# Patient Record
Sex: Male | Born: 1995 | Race: Black or African American | Hispanic: No | Marital: Single | Smoking: Never smoker
Health system: Southern US, Community
[De-identification: ages and names within clinical notes are randomized; demographics above are authoritative.]

---
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
## 2018-04-15 ENCOUNTER — Ambulatory Visit: Payer: Commercial Managed Care - HMO | Admitting: Emergency Medicine

## 2018-04-15 ENCOUNTER — Encounter: Payer: Self-pay | Admitting: Emergency Medicine

## 2018-04-15 ENCOUNTER — Other Ambulatory Visit: Payer: Self-pay

## 2018-04-15 VITALS — BP 121/78 | HR 54 | Temp 98.6°F | Resp 16 | Ht 70.0 in | Wt 151.4 lb

## 2018-04-15 DIAGNOSIS — M25511 Pain in right shoulder: Secondary | ICD-10-CM | POA: Diagnosis not present

## 2018-04-15 DIAGNOSIS — S43401A Unspecified sprain of right shoulder joint, initial encounter: Secondary | ICD-10-CM

## 2018-04-15 NOTE — Progress Notes (Signed)
Seth Weber 22 y.o.   Chief Complaint  Patient presents with  . Shoulder    right, per pt" hurts with certain arm movements" on yesterday, 04/14/18    HISTORY OF PRESENT ILLNESS: This is a 22 y.o. male complaining of right shoulder pain since yesterday.  Was working out, standing on his elbows, and injured right shoulder.  Hurting since.  HPI   Prior to Admission medications   Not on File    No Known Allergies  There are no active problems to display for this patient.   No past medical history on file.  No past surgical history on file.  Social History   Socioeconomic History  . Marital status: Single    Spouse name: Not on file  . Number of children: Not on file  . Years of education: Not on file  . Highest education level: Not on file  Occupational History  . Not on file  Social Needs  . Financial resource strain: Not on file  . Food insecurity:    Worry: Not on file    Inability: Not on file  . Transportation needs:    Medical: Not on file    Non-medical: Not on file  Tobacco Use  . Smoking status: Never Smoker  . Smokeless tobacco: Never Used  Substance and Sexual Activity  . Alcohol use: Never    Frequency: Never  . Drug use: Never  . Sexual activity: Not on file  Lifestyle  . Physical activity:    Days per week: Not on file    Minutes per session: Not on file  . Stress: Not on file  Relationships  . Social connections:    Talks on phone: Not on file    Gets together: Not on file    Attends religious service: Not on file    Active member of club or organization: Not on file    Attends meetings of clubs or organizations: Not on file    Relationship status: Not on file  . Intimate partner violence:    Fear of current or ex partner: Not on file    Emotionally abused: Not on file    Physically abused: Not on file    Forced sexual activity: Not on file  Other Topics Concern  . Not on file  Social History Narrative  . Not on file     No family history on file.   Review of Systems  Constitutional: Negative.  Negative for chills and fever.  Gastrointestinal: Negative for nausea and vomiting.  Musculoskeletal: Positive for joint pain (Right shoulder pain).  Skin: Negative.  Negative for rash.  Neurological: Negative for dizziness and headaches.  All other systems reviewed and are negative.   Vitals:   04/15/18 1214  BP: 121/78  Pulse: (!) 54  Resp: 16  Temp: 98.6 F (37 C)  SpO2: 99%    Physical Exam  Constitutional: He is oriented to person, place, and time. He appears well-developed and well-nourished.  HENT:  Head: Normocephalic and atraumatic.  Eyes: Pupils are equal, round, and reactive to light. EOM are normal.  Neck: Normal range of motion. Neck supple.  Cardiovascular: Normal rate and regular rhythm.  Pulmonary/Chest: Effort normal.  Musculoskeletal:  Right shoulder: No erythema or bruising.  Painful range of motion.  No tenderness to palpation.  Normal AC joint.  Normal clavicle.  Neurological: He is alert and oriented to person, place, and time. No sensory deficit. He exhibits normal muscle tone.  Skin: Skin  is warm and dry. Capillary refill takes less than 2 seconds.  Psychiatric: He has a normal mood and affect. His behavior is normal.  Vitals reviewed.    ASSESSMENT & PLAN: Nat was seen today for shoulder.  Diagnoses and all orders for this visit:  Acute pain of right shoulder  Sprain of right shoulder, unspecified shoulder sprain type, initial encounter   Take 1 aleve in the morning and night. Avoid any activities that cause pain for the next 5-7 days then slowly add in exercises that have not caused pain in the past. Come back in 7-14 days if pain is not improving.    Patient Instructions       IF you received an x-ray today, you will receive an invoice from [facility]. Please contact [facility] at [phone] with questions or concerns  regarding your invoice.   IF you received labwork today, you will receive an invoice from [facility]. Please contact [facility] at [phone] with questions or concerns regarding your invoice.   Our billing staff will not be able to assist you with questions regarding bills from these companies.  You will be contacted with the lab results as soon as they are available. The fastest way to get your results is to activate your My Chart account. Instructions are located on the last page of this paperwork. If you have not heard from [place] regarding the results in 2 weeks, please contact this office.     Shoulder Sprain A shoulder sprain is a partial or complete tear in one of the tough, fiber-like tissues (ligaments) in the shoulder. The ligaments in the shoulder help to hold the shoulder in place. What are the causes? This condition may be caused by:  A fall.  A hit to the shoulder.  A twist of the arm.  What increases the risk? This condition is more likely to develop in:  People who play sports.  People who have problems with balance or coordination.  What are the signs or symptoms? Symptoms of this condition include:  Pain when moving the shoulder.  Limited ability to move the shoulder.  Swelling and tenderness on top of the shoulder.  Warmth in the shoulder.  A change in the shape of the shoulder.  Redness or bruising on the shoulder.  How is this diagnosed? This condition is diagnosed with a physical exam. During the exam, you may be asked to do simple exercises with your shoulder. You may also have imaging tests, such as X-rays, MRI, or a CT scan. These tests can show how severe the sprain is. How is this treated? This condition may be treated with:  Rest.  Pain medicine.  Ice.  A sling or brace. This is used to keep the arm still while the shoulder is healing.  Physical therapy or rehabilitation exercises. These help to improve the range of motion and strength of  the shoulder.  Surgery (rare). Surgery may be needed if the sprain caused a joint to become unstable. Surgery may also be needed to reduce pain.  Some people may develop ongoing shoulder pain or lose some range of motion in the shoulder. However, most people do not develop long-term problems. Follow these instructions at home:  Rest.  Ask your health care provider when it is safe for you to drive if you have a sling or brace on your shoulder.  Take over-the-counter and prescription medicines only as told by your health care provider.  If directed, apply ice to the area: ?  Put ice in a plastic bag. ? Place a towel between your skin and the bag. ? Leave the ice on for 20 minutes, 2-3 times per day.  If you were given a shoulder sling or brace: ? Wear it as told. ? Remove it to shower or bathe. ? Move your arm only as much as told by your health care provider, but keep your hand moving to prevent swelling.  If you were shown how to do any exercises, do them as told by your health care provider.  Keep all follow-up visits as told by your health care provider. This is important. Contact a health care provider if:  Your pain gets worse.  Your pain is not relieved with medicines.  You have increased redness or swelling. Get help right away if:  You have a fever.  You cannot move your arm or shoulder.  You develop severe numbness or tingling in your arm, hand, or fingers.  Your arm, hand, or fingers turn blue, white, or gray and feel cold. This information is not intended to replace advice given to you by your health care provider. Make sure you discuss any questions you have with your health care provider. Document Released: 01/07/2009 Document Revised: 04/16/2016 Document Reviewed: 12/14/2014 Elsevier Interactive Patient Education  2018 [employer]     Edwina BarthMiguel Yajahira Tison, MD [facility]

## 2018-04-15 NOTE — Patient Instructions (Addendum)
IF you received an x-ray today, you will receive an invoice from [facility]. Please contact [facility] at [phone] with questions or concerns regarding your invoice.   IF you received labwork today, you will receive an invoice from [facility]. Please contact [facility] at [phone] with questions or concerns regarding your invoice.   Our billing staff will not be able to assist you with questions regarding bills from these companies.  You will be contacted with the lab results as soon as they are available. The fastest way to get your results is to activate your My Chart account. Instructions are located on the last page of this paperwork. If you have not heard from us regarding the results in 2 weeks, please contact this office.     Shoulder Sprain A shoulder sprain is a partial or complete tear in one of the tough, fiber-like tissues (ligaments) in the shoulder. The ligaments in the shoulder help to hold the shoulder in place. What are the causes? This condition may be caused by:  A fall.  A hit to the shoulder.  A twist of the arm.  What increases the risk? This condition is more likely to develop in:  People who play sports.  People who have problems with balance or coordination.  What are the signs or symptoms? Symptoms of this condition include:  Pain when moving the shoulder.  Limited ability to move the shoulder.  Swelling and tenderness on top of the shoulder.  Warmth in the shoulder.  A change in the shape of the shoulder.  Redness or bruising on the shoulder.  How is this diagnosed? This condition is diagnosed with a physical exam. During the exam, you may be asked to do simple exercises with your shoulder. You may also have imaging tests, such as X-rays, MRI, or a CT scan. These tests can show how severe the sprain is. How is this treated? This condition may be treated with:  Rest.  Pain medicine.  Ice.  A sling or  brace. This is used to keep the arm still while the shoulder is healing.  Physical therapy or rehabilitation exercises. These help to improve the range of motion and strength of the shoulder.  Surgery (rare). Surgery may be needed if the sprain caused a joint to become unstable. Surgery may also be needed to reduce pain.  Some people may develop ongoing shoulder pain or lose some range of motion in the shoulder. However, most people do not develop long-term problems. Follow these instructions at home:  Rest.  Ask your health care provider when it is safe for you to drive if you have a sling or brace on your shoulder.  Take over-the-counter and prescription medicines only as told by your health care provider.  If directed, apply ice to the area: ? Put ice in a plastic bag. ? Place a towel between your skin and the bag. ? Leave the ice on for 20 minutes, 2-3 times per day.  If you were given a shoulder sling or brace: ? Wear it as told. ? Remove it to shower or bathe. ? Move your arm only as much as told by your health care provider, but keep your hand moving to prevent swelling.  If you were shown how to do any exercises, do them as told by your health care provider.  Keep all follow-up visits as told by your health care provider. This is important. Contact a health care provider if:  Your pain gets  worse.  Your pain is not relieved with medicines.  You have increased redness or swelling. Get help right away if:  You have a fever.  You cannot move your arm or shoulder.  You develop severe numbness or tingling in your arm, hand, or fingers.  Your arm, hand, or fingers turn blue, white, or gray and feel cold. This information is not intended to replace advice given to you by your health care provider. Make sure you discuss any questions you have with your health care provider. Document Released: 01/07/2009 Document Revised: 04/16/2016 Document Reviewed: 12/14/2014 Elsevier  Interactive Patient Education  [employer]2018 [employer] Inc.

## 2018-04-22 ENCOUNTER — Telehealth: Payer: Self-pay | Admitting: Emergency Medicine

## 2018-04-22 NOTE — Telephone Encounter (Signed)
Try to find out more information please.

## 2018-04-22 NOTE — Telephone Encounter (Signed)
Copied from CRM [phone]. Topic: General - Other >> Apr 22, 2018 12:28 PM Raquel SarnaHayes, Teresa G wrote: Pt needing a call back from Dr. Alvy BimlerSagardia.  Pt only wanting to speak w/ Dr. Alvy BimlerSagardia.

## 2018-04-25 NOTE — Telephone Encounter (Signed)
Called spoke with wife  Called cell unable to reach patient

## 2018-05-16 ENCOUNTER — Other Ambulatory Visit: Payer: Self-pay

## 2018-05-16 ENCOUNTER — Encounter: Payer: Self-pay | Admitting: Emergency Medicine

## 2018-05-16 ENCOUNTER — Ambulatory Visit (INDEPENDENT_AMBULATORY_CARE_PROVIDER_SITE_OTHER): Payer: 59 | Admitting: Emergency Medicine

## 2018-05-16 VITALS — BP 94/49 | HR 61 | Temp 98.5°F | Resp 16 | Ht 70.0 in | Wt 151.8 lb

## 2018-05-16 DIAGNOSIS — Z0000 Encounter for general adult medical examination without abnormal findings: Secondary | ICD-10-CM | POA: Diagnosis not present

## 2018-05-16 NOTE — Progress Notes (Signed)
Seth Weber 22 y.o.   Chief Complaint  Patient presents with  . Annual Exam    for [employer]    HISTORY OF PRESENT ILLNESS: This is a 22 y.o. male Here for annual exam; no complaints and no medical concerns. 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  HPI   Prior to Admission medications   Medication Sig Start Date End Date Taking? Authorizing Provider  OVER THE COUNTER MEDICATION as needed.   Yes [provider]    No Known Allergies  There are no active problems to display for this patient.   No past medical history on file.  No past surgical history on file.  Social History   Socioeconomic History  . Marital status: Single    Spouse name: Not on file  . Number of children: Not on file  . Years of education: Not on file  . Highest education level: Not on file  Occupational History  . Not on file  Social Needs  . Financial resource strain: Not on file  . Food insecurity:    Worry: Not on file    Inability: Not on file  . Transportation needs:    Medical: Not on file    Non-medical: Not on file  Tobacco Use  . Smoking status: Never Smoker  . Smokeless tobacco: Never Used  Substance and Sexual Activity  . Alcohol use: Never    Frequency: Never  . Drug use: Never  . Sexual activity: Not on file  Lifestyle  . Physical activity:    Days per week: Not on file    Minutes per session: Not on file  . Stress: Not on file  Relationships  . Social connections:    Talks on phone: Not on file    Gets together: Not on file    Attends religious service: Not on file    Active member of club or organization: Not on file    Attends meetings of clubs or organizations: Not on file    Relationship status: Not on file  . Intimate partner violence:    Fear of current or ex partner: Not on file    Emotionally abused: Not on file    Physically abused: Not on file    Forced sexual activity: Not on file    Other Topics Concern  . Not on file  Social History Narrative  . Not on file    No family history on file.   Review of Systems  Constitutional: Negative.  Negative for chills and fever.  HENT: Negative.  Negative for hearing loss and sore throat.   Eyes: Negative.  Negative for blurred vision and double vision.  Respiratory: Negative.  Negative for cough, hemoptysis and shortness of breath.   Cardiovascular: Negative.  Negative for chest pain, palpitations and leg swelling.  Gastrointestinal: Negative.  Negative for abdominal pain, constipation, diarrhea, nausea and vomiting.  Genitourinary: Negative.   Musculoskeletal: Negative for back pain, myalgias and neck pain.  Skin: Negative.  Negative for rash.  Neurological: Negative.  Negative for dizziness and headaches.  Endo/Heme/Allergies: Negative.   All other systems reviewed and are negative.   Vitals:   05/16/18 1507  BP: (!) 94/49  Pulse: 61  Resp: 16  Temp: 98.5 F (36.9 C)  SpO2: 95%    Physical Exam  Constitutional: He is oriented to person, place, and time. He appears well-developed and well-nourished.  HENT:  Head: Normocephalic and atraumatic.  Right Ear:  External ear normal.  Left Ear: External ear normal.  Nose: Nose normal.  Mouth/Throat: Oropharynx is clear and moist.  Eyes: Pupils are equal, round, and reactive to light. Conjunctivae and EOM are normal.  Neck: Normal range of motion. Neck supple. No JVD present. No thyromegaly present.  Cardiovascular: Normal rate, regular rhythm and normal heart sounds.  Pulmonary/Chest: Effort normal and breath sounds normal.  Abdominal: Soft. Bowel sounds are normal. He exhibits no distension. There is no tenderness.  Musculoskeletal: Normal range of motion. He exhibits no edema or tenderness.  Lymphadenopathy:    He has no cervical adenopathy.  Neurological: He is alert and oriented to person, place, and time. No sensory deficit. He exhibits normal muscle tone.  Coordination normal.  Skin: Skin is warm and dry. Capillary refill takes less than 2 seconds. No rash noted.  Psychiatric: He has a normal mood and affect. His behavior is normal.  Vitals reviewed.  Form filled out.  Medically cleared to participate in training activities.  ASSESSMENT & PLAN: Landry was seen today for annual exam.  Diagnoses and all orders for this visit:  Routine general medical examination at a health care facility    Patient Instructions       If you have lab work done today you will be contacted with your lab results within the next 2 weeks.  If you have not heard from [place] then please contact us. The fastest way to get your results is to register for My Chart.   IF you received an x-ray today, you will receive an invoice from [facility]. Please contact [facility] at [phone] with questions or concerns regarding your invoice.   IF you received labwork today, you will receive an invoice from [facility]. Please contact [facility] at [phone] with questions or concerns regarding your invoice.   Our billing staff will not be able to assist you with questions regarding bills from these companies.  You will be contacted with the lab results as soon as they are available. The fastest way to get your results is to activate your My Chart account. Instructions are located on the last page of this paperwork. If you have not heard from [place] regarding the results in 2 weeks, please contact this office.      Health Maintenance, Male A healthy lifestyle and preventive care is important for your health and wellness. Ask your health care provider about what schedule of regular examinations is right for you. What should I know about weight and diet? Eat a Healthy Diet  Eat plenty of vegetables, fruits, whole grains, low-fat dairy products, and lean protein.  Do not eat a lot of foods high in solid fats, added sugars, or salt.  Maintain a Healthy  Weight Regular exercise can help you achieve or maintain a healthy weight. You should:  Do at least 150 minutes of exercise each week. The exercise should increase your heart rate and make you sweat (moderate-intensity exercise).  Do strength-training exercises at least twice a week.  Watch Your Levels of Cholesterol and Blood Lipids  Have your blood tested for lipids and cholesterol every 5 years starting at 20 years of age. If you are at high risk for heart disease, you should start having your blood tested when you are 19 years old. You may need to have your cholesterol levels checked more often if: ? Your lipid or cholesterol levels are high. ? You are older than 20 years of age. ? You are at high risk  for heart disease.  What should I know about cancer screening? Many types of cancers can be detected early and may often be prevented. Lung Cancer  You should be screened every year for lung cancer if: ? You are a current smoker who has smoked for at least 30 years. ? You are a former smoker who has quit within the past 15 years.  Talk to your health care provider about your screening options, when you should start screening, and how often you should be screened.  Colorectal Cancer  Routine colorectal cancer screening usually begins at 20 years of age and should be repeated every 5-10 years until you are 20 years old. You may need to be screened more often if early forms of precancerous polyps or small growths are found. Your health care provider may recommend screening at an earlier age if you have risk factors for colon cancer.  Your health care provider may recommend using home test kits to check for hidden blood in the stool.  A small camera at the end of a tube can be used to examine your colon (sigmoidoscopy or colonoscopy). This checks for the earliest forms of colorectal cancer.  Prostate and Testicular Cancer  Depending on your age and overall health, your health care  provider may do certain tests to screen for prostate and testicular cancer.  Talk to your health care provider about any symptoms or concerns you have about testicular or prostate cancer.  Skin Cancer  Check your skin from head to toe regularly.  Tell your health care provider about any new moles or changes in moles, especially if: ? There is a change in a mole's size, shape, or color. ? You have a mole that is larger than a pencil eraser.  Always use sunscreen. Apply sunscreen liberally and repeat throughout the day.  Protect yourself by wearing long sleeves, pants, a wide-brimmed hat, and sunglasses when outside.  What should I know about heart disease, diabetes, and high blood pressure?  If you are 3818-20 years of age, have your blood pressure checked every 3-5 years. If you are 20 years of age or older, have your blood pressure checked every year. You should have your blood pressure measured twice-once when you are at a hospital or clinic, and once when you are not at a hospital or clinic. Record the average of the two measurements. To check your blood pressure when you are not at a hospital or clinic, you can use: ? An automated blood pressure machine at a pharmacy. ? A home blood pressure monitor.  Talk to your health care provider about your target blood pressure.  If you are between 845-20 years old, ask your health care provider if you should take aspirin to prevent heart disease.  Have regular diabetes screenings by checking your fasting blood sugar level. ? If you are at a normal weight and have a low risk for diabetes, have this test once every three years after the age of 20. ? If you are overweight and have a high risk for diabetes, consider being tested at a younger age or more often.  A one-time screening for abdominal aortic aneurysm (AAA) by ultrasound is recommended for men aged 65-75 years who are current or former smokers. What should I know about preventing  infection? Hepatitis B If you have a higher risk for hepatitis B, you should be screened for this virus. Talk with your health care provider to find out if you are at risk for  hepatitis B infection. Hepatitis C Blood testing is recommended for:  Everyone born from [birth year] through 1963.  Anyone with known risk factors for hepatitis C.  Sexually Transmitted Diseases (STDs)  You should be screened each year for STDs including gonorrhea and chlamydia if: ? You are sexually active and are younger than 20 years of age. ? You are older than 20 years of age and your health care provider tells you that you are at risk for this type of infection. ? Your sexual activity has changed since you were last screened and you are at an increased risk for chlamydia or gonorrhea. Ask your health care provider if you are at risk.  Talk with your health care provider about whether you are at high risk of being infected with HIV. Your health care provider may recommend a prescription medicine to help prevent HIV infection.  What else can I do?  Schedule regular health, dental, and eye exams.  Stay current with your vaccines (immunizations).  Do not use any tobacco products, such as cigarettes, chewing tobacco, and e-cigarettes. If you need help quitting, ask your health care provider.  Limit alcohol intake to no more than 2 drinks per day. One drink equals 12 ounces of beer, 5 ounces of wine, or 1 ounces of hard liquor.  Do not use street drugs.  Do not share needles.  Ask your health care provider for help if you need support or information about quitting drugs.  Tell your health care provider if you often feel depressed.  Tell your health care provider if you have ever been abused or do not feel safe at home. This information is not intended to replace advice given to you by your health care provider. Make sure you discuss any questions you have with your health care provider. Document Released:  02/17/2008 Document Revised: 04/19/2016 Document Reviewed: 05/25/2015 Elsevier Interactive Patient Education  2018 Elsevier Inc.      Edwina Barth, MD [facility]

## 2018-05-16 NOTE — Patient Instructions (Addendum)

## 2018-05-17 ENCOUNTER — Encounter: Payer: Self-pay | Admitting: *Deleted

## 2018-06-14 ENCOUNTER — Ambulatory Visit: Payer: Self-pay

## 2018-06-14 ENCOUNTER — Other Ambulatory Visit: Payer: Self-pay | Admitting: Occupational Medicine

## 2018-06-14 DIAGNOSIS — M25531 Pain in right wrist: Secondary | ICD-10-CM

## 2019-02-06 IMAGING — DX DG WRIST COMPLETE 3+V*R*
4 series · 4 of 4 positions shown · non-contrast
Comparison: None.

CLINICAL DATA: Hyperextended right wrist 05/25/2018; persistent pain
right wrist/Kristian

EXAM:
RIGHT WRIST - COMPLETE 3+ VIEW

[wrist pa]
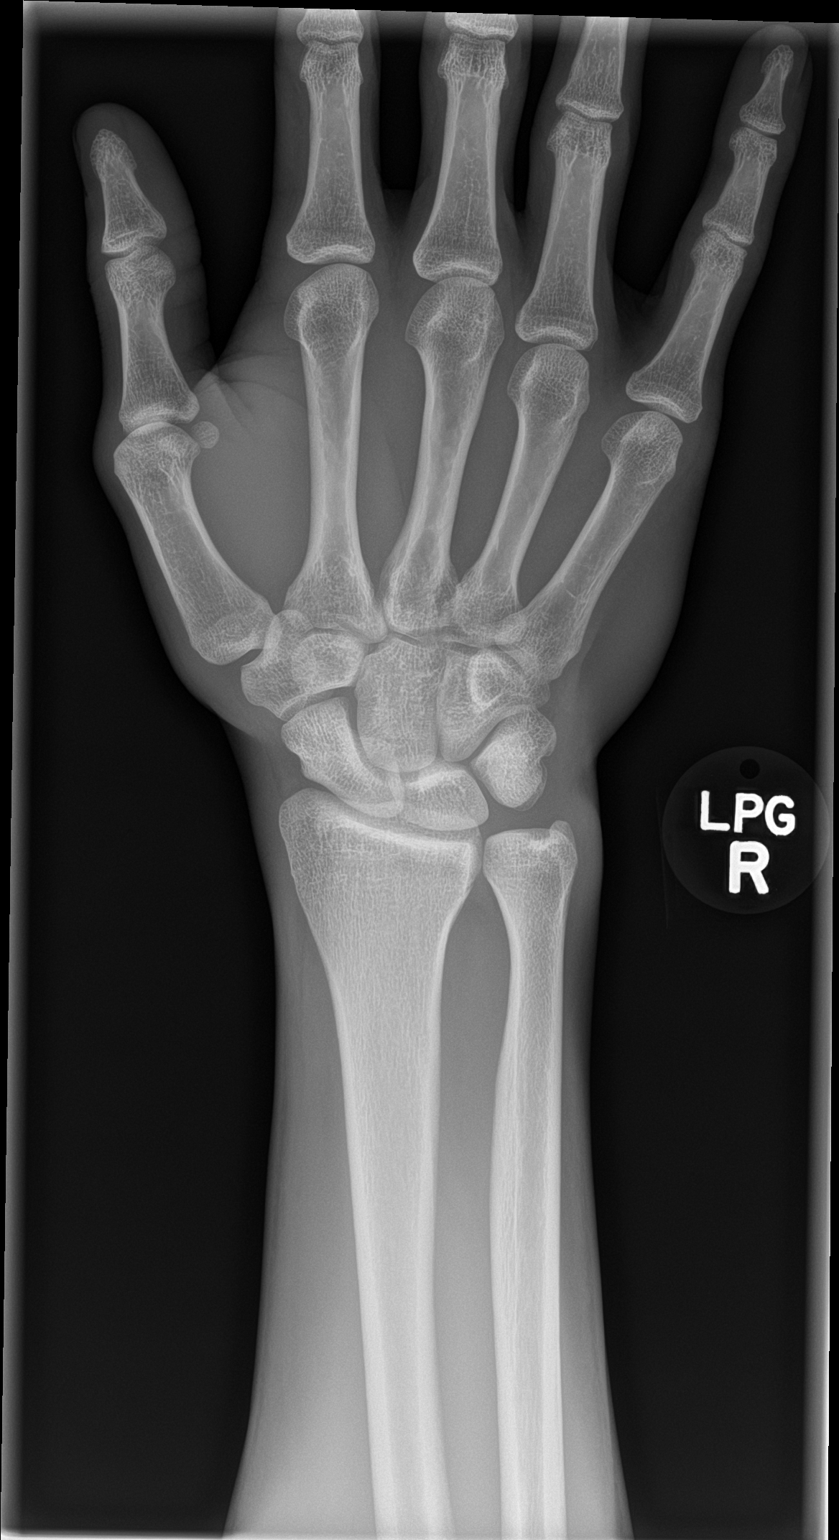

[wrist obl]
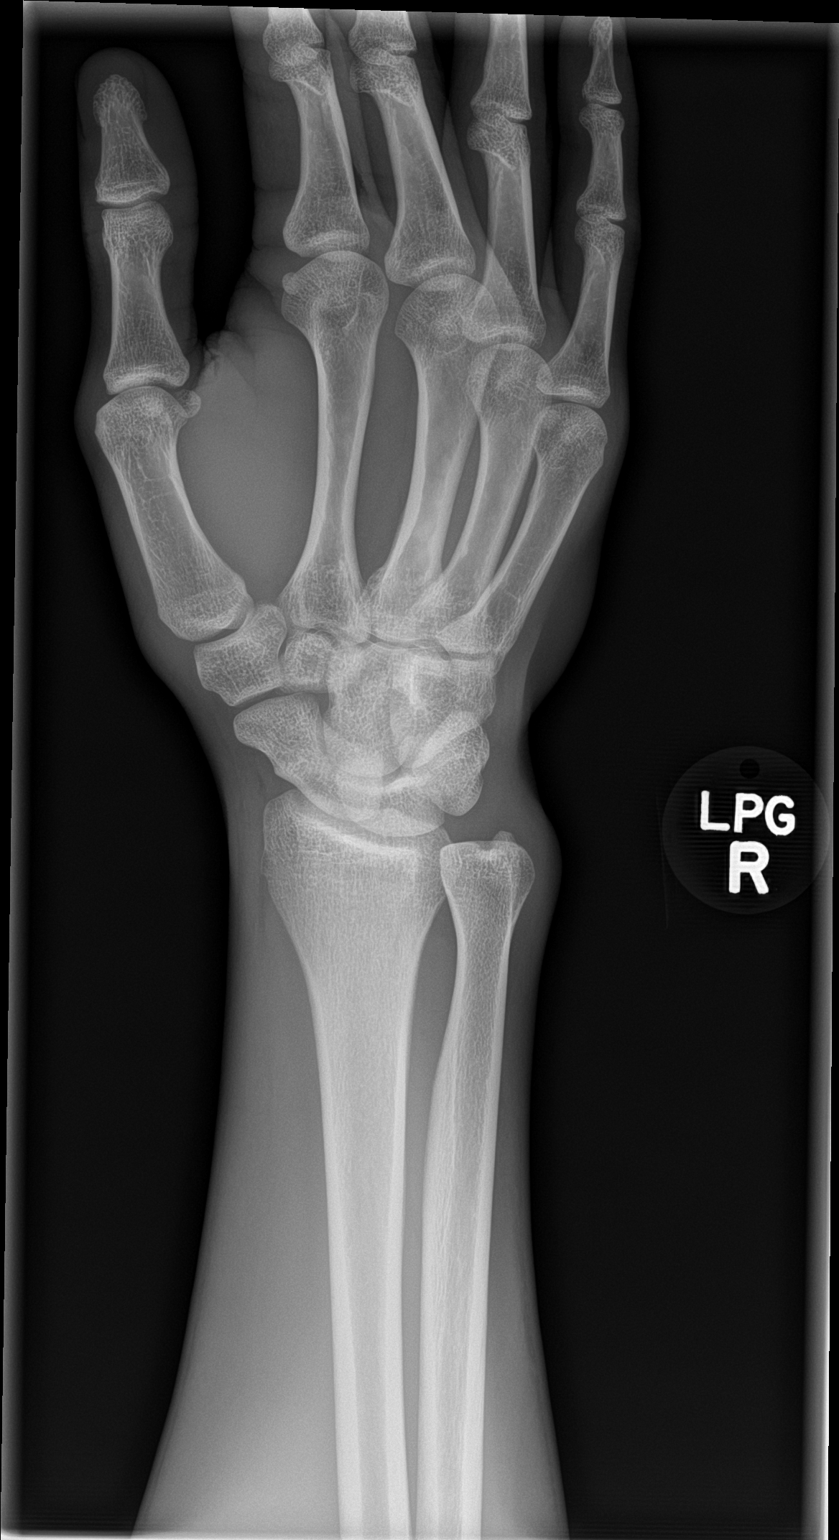

[wrist lat]
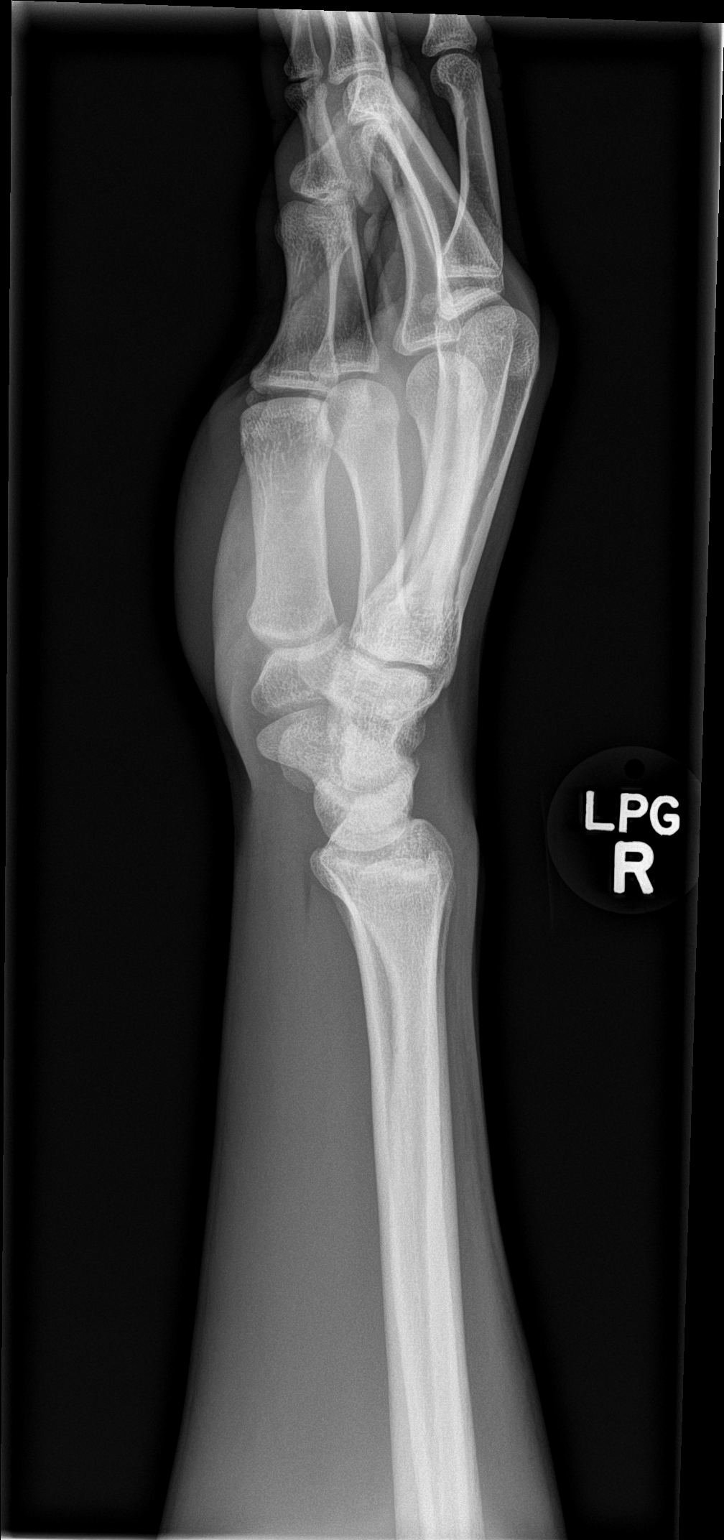

[wrist navicular]
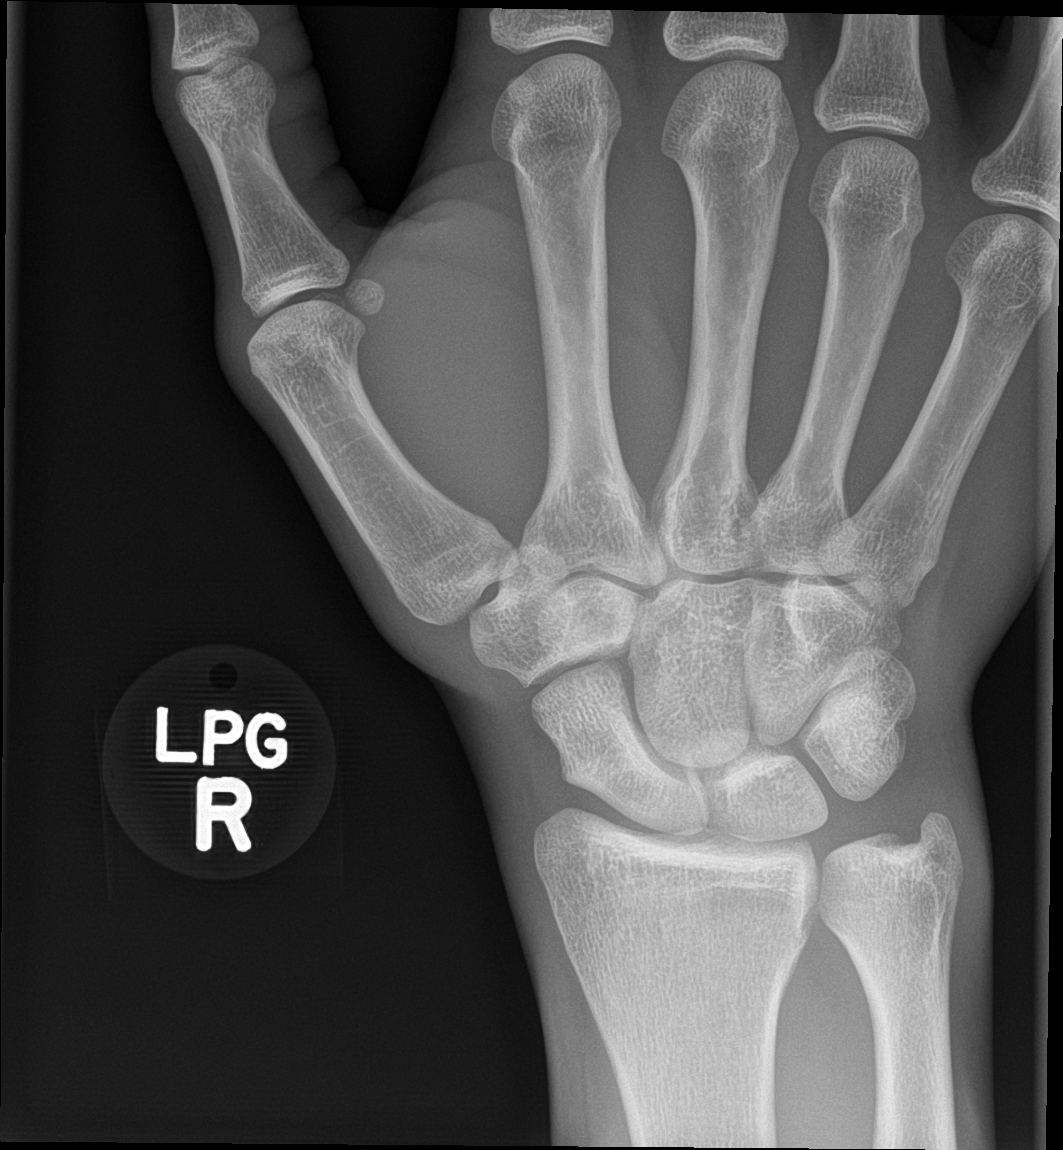

[4 of 4 positions shown; findings below may reference images not displayed]

FINDINGS: Osseous alignment is normal. No fracture line or displaced fracture
fragment seen. Adjacent soft tissues are unremarkable.
IMPRESSION: Negative.

## 2019-05-19 ENCOUNTER — Other Ambulatory Visit: Payer: Self-pay

## 2019-05-19 ENCOUNTER — Other Ambulatory Visit (HOSPITAL_COMMUNITY)
Admission: RE | Admit: 2019-05-19 | Discharge: 2019-05-19 | Disposition: A | Discharge status: Home / Self Care | Payer: 59 | Source: Ambulatory Visit | Attending: Family Medicine | Admitting: Family Medicine

## 2019-05-19 ENCOUNTER — Encounter: Payer: Self-pay | Admitting: Family Medicine

## 2019-05-19 ENCOUNTER — Ambulatory Visit: Payer: 59 | Admitting: Family Medicine

## 2019-05-19 VITALS — BP 121/71 | HR 64 | Temp 98.3°F | Resp 12 | Wt 149.4 lb

## 2019-05-19 DIAGNOSIS — N342 Other urethritis: Secondary | ICD-10-CM | POA: Diagnosis not present

## 2019-05-19 DIAGNOSIS — N341 Nonspecific urethritis: Secondary | ICD-10-CM | POA: Diagnosis present

## 2019-05-19 DIAGNOSIS — H538 Other visual disturbances: Secondary | ICD-10-CM

## 2019-05-19 DIAGNOSIS — M549 Dorsalgia, unspecified: Secondary | ICD-10-CM

## 2019-05-19 DIAGNOSIS — R369 Urethral discharge, unspecified: Secondary | ICD-10-CM | POA: Insufficient documentation

## 2019-05-19 DIAGNOSIS — R51 Headache: Secondary | ICD-10-CM | POA: Diagnosis not present

## 2019-05-19 DIAGNOSIS — R519 Headache, unspecified: Secondary | ICD-10-CM

## 2019-05-19 LAB — POCT URINALYSIS DIP (MANUAL ENTRY)
Bilirubin, UA: NEGATIVE
Blood, UA: NEGATIVE
Glucose, UA: NEGATIVE mg/dL
Ketones, POC UA: NEGATIVE mg/dL
Leukocytes, UA: NEGATIVE
Nitrite, UA: NEGATIVE
Protein Ur, POC: NEGATIVE mg/dL
Spec Grav, UA: 1.025 (ref 1.010–1.025)
Urobilinogen, UA: 0.2 E.U./dL
pH, UA: 8 (ref 5.0–8.0)

## 2019-05-19 LAB — POC MICROSCOPIC URINALYSIS (UMFC): Mucus: ABSENT

## 2019-05-19 MED ORDER — CEFTRIAXONE SODIUM 250 MG IJ SOLR
250.0000 mg | Freq: Once | INTRAMUSCULAR | Status: AC
  Administered 2019-05-19: 17:00:00 250 mg via INTRAMUSCULAR

## 2019-05-19 MED ORDER — AZITHROMYCIN 250 MG PO TABS: ORAL_TABLET | ORAL | 0 refills | Status: DC

## 2019-05-19 NOTE — Patient Instructions (Addendum)
I will recheck the STI test as we discussed, but would recommend starting antibiotic for treatment for various causes of  urethritis. Injection given here, pills at pharmacy.  See information below on urethritis.  If symptoms or not improving within the next week or worsening sooner, return for recheck.  I will refer you to an ophthalmologist to evaluate the right-sided headache and blurry vision.  If any acute worsening be seen right away.  Back pain appears to be in the muscles.  Does not appear to be related to the urinary symptoms at this time.  Stretches, range of motion, heat or ice to that area is fine, and recheck in the next 1 week if not improving.   Return to the clinic or go to the nearest emergency room if any of your symptoms worsen or new symptoms occur.   Urethritis, Adult  Urethritis is swelling (inflammation) of the urethra. The urethra is the tube that drains urine from the bladder. It is important to get treatment for this condition early. Delayed treatment may lead to complications. What are the causes? This condition may be caused by:  Germs that are spread through sexual contact. This is the leading cause of urethritis. This may include bacterial or viral infections.  Injury to the urethra. Injury can happen after a thin, flexible tube (catheter) is inserted into the urethra to drain urine, or after medical instruments or foreign bodies are inserted into the area.  Chemical irritation. This may include contact with spermicide.  A disease that causes inflammation. This is rare. What increases the risk? The following factors may make you more likely to develop this condition:  Having sex without using a condom.  Having multiple sexual partners.  Having poor hygiene. What are the signs or symptoms? Symptoms of this condition include:  Pain with urination.  Frequent urination.  Urgent need to urinate.  Itching and pain in the vagina or penis.  Discharge  coming from the penis. However, women rarely have symptoms. How is this diagnosed? This condition is diagnosed based on your medical history and symptoms as well as a physical exam. Tests may also be done. These may include:  Urine tests.  Swabs from the urethra. How is this treated? Treatment for this condition depends on the cause. Urethritis caused by a bacterial infection is treated with antibiotic medicine. Any sexual partners must also be treated. Follow these instructions at home: Medicines  Take over-the-counter and prescription medicines only as told by your health care provider.  If you were prescribed antibiotic medicine, take it as told by your health care provider. Do not stop taking the antibiotic even if you start to feel better. Lifestyle  Avoid using perfumed soaps, bubble bath, and shampoo when you bathe or shower. Rinse the vaginal area after bathing.  Wear cotton underwear. Not wearing underwear when going to bed can help.  Make sure to wipe from front to back after using the toilet if you are male.  Do not have sex until your health care provider approves. When you do have sex, be sure to practice safe sex.  Tell anyone with whom you have had sexual relations in the past 60 days that he or she may be at risk of infection. General instructions  Drink enough fluid to keep your urine clear or pale yellow.  It is up to you to get your test results. Ask your health care provider, or the department that is doing the test, when your results will be  ready.  Keep all follow-up visits as told by your health care provider. This is important.  Get tested again 3 months after treatment to make sure the infection is gone. It is important that your sexual partner also gets tested again. Contact a health care provider if:  Your symptoms have not improved after 3 days.  Your symptoms get worse.  You have eye redness or pain.  You develop abdominal pain or pelvic pain  (in females).  You develop joint pain.  You have a fever. Get help right away if:  You have severe pain in the belly, back, or side.  You vomit repeatedly. Summary  Urethritis is a swelling (inflammation) of the urethra.  This condition is caused by germs that are spread through sexual contact. This is the main cause of this illness.  It is important to get treatment for this condition early. Delayed treatment may lead to complications.  Treatment for this condition depends on the cause. Any sexual partners must also be treated. This information is not intended to replace advice given to you by your health care provider. Make sure you discuss any questions you have with your health care provider. Document Released: 02/14/2001 Document Revised: 08/03/2017 Document Reviewed: 09/26/2016 Elsevier Patient Education  [employer].   If you have lab work done today you will be contacted with your lab results within the next 2 weeks.  If you have not heard from [place] then please contact us. The fastest way to get your results is to register for My Chart.   IF you received an x-ray today, you will receive an invoice from [facility]. Please contact [facility] at [phone] with questions or concerns regarding your invoice.   IF you received labwork today, you will receive an invoice from [facility]. Please contact [facility] at [phone] with questions or concerns regarding your invoice.   Our billing staff will not be able to assist you with questions regarding bills from these companies.  You will be contacted with the lab results as soon as they are available. The fastest way to get your results is to activate your My Chart account. Instructions are located on the last page of this paperwork. If you have not heard from [place] regarding the results in 2 weeks, please contact this office.

## 2019-05-19 NOTE — Progress Notes (Signed)
Subjective:    Patient ID: Seth Weber, male    DOB: 04/03/1996, 23 y.o.   MRN: [id number]  HPI Seth Weber is a 23 y.o. male Presents today for: Chief Complaint  Patient presents with  . Exposure to STD    Clear discharge, no burning, some itching. Some back pain ans some headache. Patient went to the health dept on 05/13/19 had some STD testing but was concern that this may be epididymitis. Discharge appeared after heavy leg pres. Also was checked for UTI which was neg.    Penile discharge: Noticed 1 week ago. Clear d/c.  Tingling feeling in tip of penia and into testicles initially. Notices in am only, improves after 1st urination in am. Some slight tingling in penis during the day. No dysuria, no hematuria.  No abd pain - hungry feeling.  Some back pain - all over. 2 days ago. Physically active. Leg press 10 days ago. Some stretch in glutes. Some boxing this week - NKI.  Some urinary frequency.  Occasional pain in head- R side of head. R sided headache past 6 days since having blood drawn. Pain behind left eye. Feel liek slightly blurry on that side since last week No fever/cough/dyspena/sore throat/chnage in taste or smell.  Seen 6 days ago at health dept. Checked urine test, HIV, RPR, chlamydia and gonorrhea.Marland Kitchen. HIV test pending, but other tests normal.  No external rash.  No vomiting.  Chlamydia treated last year - worse discharge and irritation at that time.   Current sexual partner 2-3 years.  unprotected intercourse, but monogamous. Vaginal intercourse only, no anal intercourse.   There are no active problems to display for this patient.  No past medical history on file. No past surgical history on file. Not on File Prior to Admission medications   Not on File   Social History   Socioeconomic History  . Marital status: Single    Spouse name: Not on file  . Number of children: Not on file  . Years of education: Not on file  . Highest education level:  Not on file  Occupational History  . Not on file  Social Needs  . Financial resource strain: Not on file  . Food insecurity    Worry: Not on file    Inability: Not on file  . Transportation needs    Medical: Not on file    Non-medical: Not on file  Tobacco Use  . Smoking status: Never Smoker  . Smokeless tobacco: Never Used  Substance and Sexual Activity  . Alcohol use: Yes    Frequency: Never    Comment: all above  . Drug use: Never  . Sexual activity: Not on file  Lifestyle  . Physical activity    Days per week: Not on file    Minutes per session: Not on file  . Stress: Not on file  Relationships  . Social Musicianconnections    Talks on phone: Not on file    Gets together: Not on file    Attends religious service: Not on file    Active member of club or organization: Not on file    Attends meetings of clubs or organizations: Not on file    Relationship status: Not on file  . Intimate partner violence    Fear of current or ex partner: Not on file    Emotionally abused: Not on file    Physically abused: Not on file    Forced sexual activity: Not on file  Other Topics Concern  . Not on file  Social History Narrative  . Not on file    Review of Systems Per HPI.    Objective:   Physical Exam Vitals signs reviewed.  Constitutional:      General: He is not in acute distress.    Appearance: He is well-developed.  HENT:     Head: Normocephalic and atraumatic.  Eyes:     Extraocular Movements: Extraocular movements intact.     Conjunctiva/sclera: Conjunctivae normal.     Pupils: Pupils are equal, round, and reactive to light.  Cardiovascular:     Rate and Rhythm: Normal rate.  Pulmonary:     Effort: Pulmonary effort is normal.  Abdominal:     Tenderness: There is no right CVA tenderness or left CVA tenderness.     Comments: No focal ttp /rebound/guarding.   Genitourinary:    Pubic Area: No rash.      Penis: Discharge (clear ) present. No swelling or lesions.       Scrotum/Testes: Normal.        Right: Tenderness not present.        Left: Tenderness not present.     Epididymis:     Right: Normal.     Left: Normal.  Musculoskeletal:       Back:  Lymphadenopathy:     Lower Body: Left inguinal adenopathy (small node on left. nontender. ) present.  Neurological:     Mental Status: He is alert and oriented to person, place, and time.    Vitals:   05/19/19 1527  BP: 121/71  Pulse: 64  Resp: 12  Temp: 98.3 F (36.8 C)  TempSrc: Oral  SpO2: 99%  Weight: 149 lb 6.4 oz (67.8 kg)    Hearing Screening   125Hz  250Hz  500Hz  1000Hz  2000Hz  3000Hz  4000Hz  6000Hz  8000Hz   Right ear:           Left ear:             Visual Acuity Screening   Right eye Left eye Both eyes  Without correction: 20/15 20/15 20/15   With correction:          Assessment & Plan:    Seth Weber is a 23 y.o. male Penile discharge - Plan: GC/Chlamydia probe amp ([place])not at [facility], POCT Microscopic Urinalysis ([facility]), POCT urinalysis dipstick, azithromycin (ZITHROMAX) 250 MG tablet, cefTRIAXone (ROCEPHIN) injection 250 mg Urethritis - Plan: GC/Chlamydia probe amp ([place])not at [facility], POCT Microscopic Urinalysis ([facility]), POCT urinalysis dipstick, azithromycin (ZITHROMAX) 250 MG tablet  -Concerning for infective urethritis.  Reports negative STI testing last week but very early in the symptoms at that time.  Will repeat testing, Rocephin, azithromycin given to treat for common causes urethritis but also discussed nongonococcal urethritis with azithromycin treatment.  RTC precautions  Blurred vision, right eye - Plan: Ambulatory referral to Ophthalmology Right-sided headache - Plan: Ambulatory referral to Ophthalmology  -Past week, normal acuity testing in office.  Will have evaluated by ophthalmology few days.  RTC precautions/ER precautions  Upper back pain  -Diffuse, suspected mechanical/paraspinal muscles with workouts.  Imaging deferred at present, symptomatic  care discussed with RTC precautions.  Meds ordered this encounter  Medications  . azithromycin (ZITHROMAX) 250 MG tablet    Sig: Take 4 pills by mouth once    Dispense:  4 tablet    Refill:  0  . cefTRIAXone (ROCEPHIN) injection 250 mg   Patient Instructions    I will recheck the STI test as  we discussed, but would recommend starting antibiotic for treatment for various causes of  urethritis. Injection given here, pills at pharmacy.  See information below on urethritis.  If symptoms or not improving within the next week or worsening sooner, return for recheck.  I will refer you to an ophthalmologist to evaluate the right-sided headache and blurry vision.  If any acute worsening be seen right away.  Back pain appears to be in the muscles.  Does not appear to be related to the urinary symptoms at this time.  Stretches, range of motion, heat or ice to that area is fine, and recheck in the next 1 week if not improving.   Return to the clinic or go to the nearest emergency room if any of your symptoms worsen or new symptoms occur.   Urethritis, Adult  Urethritis is swelling (inflammation) of the urethra. The urethra is the tube that drains urine from the bladder. It is important to get treatment for this condition early. Delayed treatment may lead to complications. What are the causes? This condition may be caused by:  Germs that are spread through sexual contact. This is the leading cause of urethritis. This may include bacterial or viral infections.  Injury to the urethra. Injury can happen after a thin, flexible tube (catheter) is inserted into the urethra to drain urine, or after medical instruments or foreign bodies are inserted into the area.  Chemical irritation. This may include contact with spermicide.  A disease that causes inflammation. This is rare. What increases the risk? The following factors may make you more likely to develop this condition:  Having sex without  using a condom.  Having multiple sexual partners.  Having poor hygiene. What are the signs or symptoms? Symptoms of this condition include:  Pain with urination.  Frequent urination.  Urgent need to urinate.  Itching and pain in the vagina or penis.  Discharge coming from the penis. However, women rarely have symptoms. How is this diagnosed? This condition is diagnosed based on your medical history and symptoms as well as a physical exam. Tests may also be done. These may include:  Urine tests.  Swabs from the urethra. How is this treated? Treatment for this condition depends on the cause. Urethritis caused by a bacterial infection is treated with antibiotic medicine. Any sexual partners must also be treated. Follow these instructions at home: Medicines  Take over-the-counter and prescription medicines only as told by your health care provider.  If you were prescribed antibiotic medicine, take it as told by your health care provider. Do not stop taking the antibiotic even if you start to feel better. Lifestyle  Avoid using perfumed soaps, bubble bath, and shampoo when you bathe or shower. Rinse the vaginal area after bathing.  Wear cotton underwear. Not wearing underwear when going to bed can help.  Make sure to wipe from front to back after using the toilet if you are male.  Do not have sex until your health care provider approves. When you do have sex, be sure to practice safe sex.  Tell anyone with whom you have had sexual relations in the past 60 days that he or she may be at risk of infection. General instructions  Drink enough fluid to keep your urine clear or pale yellow.  It is up to you to get your test results. Ask your health care provider, or the department that is doing the test, when your results will be ready.  Keep all follow-up visits as  told by your health care provider. This is important.  Get tested again 3 months after treatment to make sure  the infection is gone. It is important that your sexual partner also gets tested again. Contact a health care provider if:  Your symptoms have not improved after 3 days.  Your symptoms get worse.  You have eye redness or pain.  You develop abdominal pain or pelvic pain (in females).  You develop joint pain.  You have a fever. Get help right away if:  You have severe pain in the belly, back, or side.  You vomit repeatedly. Summary  Urethritis is a swelling (inflammation) of the urethra.  This condition is caused by germs that are spread through sexual contact. This is the main cause of this illness.  It is important to get treatment for this condition early. Delayed treatment may lead to complications.  Treatment for this condition depends on the cause. Any sexual partners must also be treated. This information is not intended to replace advice given to you by your health care provider. Make sure you discuss any questions you have with your health care provider. Document Released: 02/14/2001 Document Revised: 08/03/2017 Document Reviewed: 09/26/2016 Elsevier Patient Education  [employer].   If you have lab work done today you will be contacted with your lab results within the next 2 weeks.  If you have not heard from [place] then please contact us. The fastest way to get your results is to register for My Chart.   IF you received an x-ray today, you will receive an invoice from [facility]. Please contact [facility] at [phone] with questions or concerns regarding your invoice.   IF you received labwork today, you will receive an invoice from [facility]. Please contact [facility] at [phone] with questions or concerns regarding your invoice.   Our billing staff will not be able to assist you with questions regarding bills from these companies.  You will be contacted with the lab results as soon as they are available. The fastest way to get your  results is to activate your My Chart account. Instructions are located on the last page of this paperwork. If you have not heard from [place] regarding the results in 2 weeks, please contact this office.       Signed,   Meredith Staggers, MD [facility].  05/20/19 1:28 PM

## 2019-05-20 ENCOUNTER — Encounter: Payer: Self-pay | Admitting: Family Medicine

## 2019-05-21 LAB — GC/CHLAMYDIA PROBE AMP (~~LOC~~) NOT AT ARMC
Chlamydia: NEGATIVE
Neisseria Gonorrhea: NEGATIVE

## 2019-05-23 ENCOUNTER — Telehealth: Payer: Self-pay | Admitting: Family Medicine

## 2019-05-23 NOTE — Telephone Encounter (Signed)
results

## 2019-05-26 ENCOUNTER — Telehealth: Payer: Self-pay | Admitting: Family Medicine

## 2019-05-26 NOTE — Telephone Encounter (Signed)
Pt would like a CB concerning his lab results from 05/19/19. Please only call pt on this line. [phone]

## 2019-05-27 ENCOUNTER — Other Ambulatory Visit: Payer: Self-pay

## 2019-05-27 DIAGNOSIS — R369 Urethral discharge, unspecified: Secondary | ICD-10-CM

## 2019-05-27 NOTE — Telephone Encounter (Signed)
Informed pt of labs, he verbalized understanding. Will come into the office today for more labs.

## 2019-05-27 NOTE — Telephone Encounter (Signed)
Requesting cb for lab results. Pt seen on 9/14

## 2019-06-02 ENCOUNTER — Telehealth: Payer: Self-pay | Admitting: Family Medicine

## 2019-06-02 NOTE — Telephone Encounter (Signed)
Pt requesting CB. Did not want to leave details but asked for Carlota Raspberry to cb Advised pt that anyone may call him back

## 2019-06-02 NOTE — Telephone Encounter (Signed)
Spoke with pt about concern, he verbalized understanding. Future labs has been placed in for him to get additional test done since he is still having discharge.

## 2019-06-03 ENCOUNTER — Other Ambulatory Visit (HOSPITAL_COMMUNITY)
Admission: RE | Admit: 2019-06-03 | Discharge: 2019-06-03 | Disposition: A | Discharge status: Home / Self Care | Payer: 59 | Source: Ambulatory Visit | Attending: Family Medicine | Admitting: Family Medicine

## 2019-06-03 ENCOUNTER — Other Ambulatory Visit: Payer: Self-pay

## 2019-06-03 ENCOUNTER — Ambulatory Visit (INDEPENDENT_AMBULATORY_CARE_PROVIDER_SITE_OTHER): Payer: 59 | Admitting: Emergency Medicine

## 2019-06-03 DIAGNOSIS — R369 Urethral discharge, unspecified: Secondary | ICD-10-CM | POA: Insufficient documentation

## 2019-06-04 LAB — URINE CYTOLOGY ANCILLARY ONLY
Molecular Disclaimer: NEGATIVE
Trichomonas: NEGATIVE

## 2019-06-04 LAB — HIV ANTIBODY (ROUTINE TESTING W REFLEX): HIV Screen 4th Generation wRfx: NONREACTIVE

## 2019-06-04 LAB — RPR: RPR Ser Ql: NONREACTIVE

## 2019-06-04 NOTE — Progress Notes (Signed)
Labs only visit

## 2019-06-10 ENCOUNTER — Telehealth: Payer: Self-pay | Admitting: Family Medicine

## 2019-06-10 DIAGNOSIS — R369 Urethral discharge, unspecified: Secondary | ICD-10-CM

## 2019-06-10 NOTE — Telephone Encounter (Signed)
Pt would like a cb concerning his lab results. [phone]

## 2019-06-11 NOTE — Telephone Encounter (Signed)
Called and informed pt of labs, he verbalized understanding. He also informed me that he is still having the discharge and would like a referral to Urology. Would you like me to send referral at this time?

## 2019-06-12 NOTE — Telephone Encounter (Signed)
I do not mind referring him to urology, but if he is having persistent discharge, would recommend repeat urine  testing/evaluation in the office to determine if other treatments should be started prior to urology visit.  Thanks.

## 2019-06-13 NOTE — Telephone Encounter (Signed)
Attempted to call pt back. His daughter answered the phone. She stated that her dad was at work so I have asked her to let her know this is [place] and to give [place] a call back with the number. She stated understanding. We will try to contact pt again

## 2019-06-23 ENCOUNTER — Other Ambulatory Visit: Payer: Self-pay

## 2019-06-23 DIAGNOSIS — Z20822 Contact with and (suspected) exposure to covid-19: Secondary | ICD-10-CM

## 2019-06-25 LAB — NOVEL CORONAVIRUS, NAA: SARS-CoV-2, NAA: NOT DETECTED

## 2019-07-18 ENCOUNTER — Other Ambulatory Visit: Payer: Self-pay

## 2019-07-18 DIAGNOSIS — Z20822 Contact with and (suspected) exposure to covid-19: Secondary | ICD-10-CM

## 2019-07-19 ENCOUNTER — Other Ambulatory Visit: Payer: Self-pay | Admitting: Family Medicine

## 2019-07-19 DIAGNOSIS — R369 Urethral discharge, unspecified: Secondary | ICD-10-CM

## 2019-07-19 DIAGNOSIS — N342 Other urethritis: Secondary | ICD-10-CM

## 2019-07-21 LAB — NOVEL CORONAVIRUS, NAA: SARS-CoV-2, NAA: NOT DETECTED

## 2019-07-21 MED ORDER — AZITHROMYCIN 250 MG PO TABS: ORAL_TABLET | ORAL | 0 refills | Status: DC

## 2019-09-06 ENCOUNTER — Other Ambulatory Visit: Payer: Self-pay | Admitting: Family Medicine

## 2019-09-06 DIAGNOSIS — N342 Other urethritis: Secondary | ICD-10-CM

## 2019-09-06 DIAGNOSIS — R369 Urethral discharge, unspecified: Secondary | ICD-10-CM

## 2019-09-09 NOTE — Telephone Encounter (Signed)
Attempted to call patient with PCP response- no answer and unable to leave message.

## 2019-09-09 NOTE — Telephone Encounter (Signed)
Azithromycin should have treated infection, but if any persistent symptoms there is a possibility of resistance to that antibiotic.  If any continued or return of symptoms, would recommend office visit for repeat testing to determine if different antibiotic indicated.  Thanks.

## 2019-09-09 NOTE — Telephone Encounter (Signed)
I have sent the pt a message viva mychart explaining to the pt that he would need to come in for an office visit before we could refill this medication.

## 2019-09-11 ENCOUNTER — Other Ambulatory Visit (HOSPITAL_COMMUNITY)
Admission: RE | Admit: 2019-09-11 | Discharge: 2019-09-11 | Disposition: A | Discharge status: Home / Self Care | Payer: 59 | Source: Ambulatory Visit | Attending: Family Medicine | Admitting: Family Medicine

## 2019-09-11 ENCOUNTER — Other Ambulatory Visit: Payer: Self-pay

## 2019-09-11 ENCOUNTER — Ambulatory Visit: Payer: 59 | Admitting: Family Medicine

## 2019-09-11 ENCOUNTER — Encounter: Payer: Self-pay | Admitting: Family Medicine

## 2019-09-11 VITALS — BP 125/76 | HR 69 | Temp 98.7°F | Ht 70.0 in | Wt 150.2 lb

## 2019-09-11 DIAGNOSIS — Z202 Contact with and (suspected) exposure to infections with a predominantly sexual mode of transmission: Secondary | ICD-10-CM

## 2019-09-11 DIAGNOSIS — R369 Urethral discharge, unspecified: Secondary | ICD-10-CM

## 2019-09-11 DIAGNOSIS — Z23 Encounter for immunization: Secondary | ICD-10-CM

## 2019-09-11 NOTE — Patient Instructions (Addendum)
I will recheck testing as we discussed today.  If any return of discharge, burning or new symptoms with urination (especially if that testing is normal) let me know as other testing may be needed.   Return to the clinic or go to the nearest emergency room if any of your symptoms worsen or new symptoms occur.   If you have lab work done today you will be contacted with your lab results within the next 2 weeks.  If you have not heard from [place] then please contact us. The fastest way to get your results is to register for My Chart.   IF you received an x-ray today, you will receive an invoice from [facility]. Please contact [facility] at [phone] with questions or concerns regarding your invoice.   IF you received labwork today, you will receive an invoice from [place]. Please contact [facility] at [phone] with questions or concerns regarding your invoice.   Our billing staff will not be able to assist you with questions regarding bills from these companies.  You will be contacted with the lab results as soon as they are available. The fastest way to get your results is to activate your My Chart account. Instructions are located on the last page of this paperwork. If you have not heard from [place] regarding the results in 2 weeks, please contact this office.

## 2019-09-11 NOTE — Progress Notes (Signed)
Subjective:  Patient ID: Seth Weber, male    DOB: [birth year]  Age: 24 y.o. MRN: [id number]  CC:  Chief Complaint  Patient presents with  . Follow-up    on labs and review medication. pt may want some labs for mono and myco plasma genitailia exposure from pts partner. pt had some discharge last weekend clear in color.    HPI Seth Weber presents for   Evaluation for mono and mycoplasma exposure.  I last evaluated in September to 14th for penile discharge.  Suspected infective urethritis at that time, treated with Rocephin 250 mg injection, azithromycin 1000 mg x 1.  Ultimately with testing had negative HIV, RPR, chlamydia and gonorrhea testing on the 14th.  Trichomonas testing was ordered September 29 which was also negative.   Did follow up with urology- treated with azithromycin again in November. Symptoms were improving.   Told by partner that she had mycoplasma back in September, she has been treated.  She had mono with throat infection at that time.  He denies any fever, night sweats, sore throat. monogamous  Thinks he may have seen some discharge last weekend,  None now, no dysuria. No abd pain. No fever. No penile rash.    History There are no problems to display for this patient.  History reviewed. No pertinent past medical history. History reviewed. No pertinent surgical history. No Known Allergies Prior to Admission medications   Medication Sig Start Date End Date Taking? Authorizing Provider  azithromycin (ZITHROMAX) 250 MG tablet Take 4 pills by mouth once Patient not taking: Reported on 09/11/2019 07/21/19   Shade Flood, MD   Social History   Socioeconomic History  . Marital status: Single    Spouse name: Not on file  . Number of children: Not on file  . Years of education: Not on file  . Highest education level: Not on file  Occupational History  . Not on file  Tobacco Use  . Smoking status: Never Smoker  . Smokeless tobacco: Never  Used  Substance and Sexual Activity  . Alcohol use: Yes    Comment: all above  . Drug use: Never  . Sexual activity: Not on file  Other Topics Concern  . Not on file  Social History Narrative  . Not on file   Social Determinants of Health   Financial Resource Strain:   . Difficulty of Paying Living Expenses: Not on file  Food Insecurity:   . Worried About Programme researcher, broadcasting/film/video in the Last Year: Not on file  . Ran Out of Food in the Last Year: Not on file  Transportation Needs:   . Lack of Transportation (Medical): Not on file  . Lack of Transportation (Non-Medical): Not on file  Physical Activity:   . Days of Exercise per Week: Not on file  . Minutes of Exercise per Session: Not on file  Stress:   . Feeling of Stress : Not on file  Social Connections:   . Frequency of Communication with Friends and Family: Not on file  . Frequency of Social Gatherings with Friends and Family: Not on file  . Attends Religious Services: Not on file  . Active Member of Clubs or Organizations: Not on file  . Attends Banker Meetings: Not on file  . Marital Status: Not on file  Intimate Partner Violence:   . Fear of Current or Ex-Partner: Not on file  . Emotionally Abused: Not on file  . Physically Abused: Not  on file  . Sexually Abused: Not on file    Review of Systems  Per HPI.  Objective:   Vitals:   09/11/19 1455  BP: 125/76  Pulse: 69  Temp: 98.7 F (37.1 C)  TempSrc: Temporal  SpO2: 95%  Weight: 150 lb 3.2 oz (68.1 kg)  Height: 5\' 10"  (1.778 m)    Physical Exam Vitals reviewed.  Constitutional:      General: He is not in acute distress.    Appearance: He is well-developed.  HENT:     Head: Normocephalic and atraumatic.  Cardiovascular:     Rate and Rhythm: Normal rate.  Pulmonary:     Effort: Pulmonary effort is normal.  Genitourinary:    Penis: Normal. No erythema, tenderness, discharge, swelling or lesions.   Neurological:     Mental Status: He is  alert and oriented to person, place, and time.        Assessment & Plan:  Seth Weber is a 24 y.o. male . Penile discharge - Plan: Mycoplasma / Ureaplasma Culture, Urine cytology ancillary only, CANCELED: Chlamydia/Gonococcus/Trichomonas, NAA Exposure to sexually transmitted disease (STD) - Plan: Mycoplasma / Ureaplasma Culture, Urine cytology ancillary only, CANCELED: Chlamydia/Gonococcus/Trichomonas, NAA  -Asymptomatic on exam.  Possible mycoplasma exposure, previous treatment with azithromycin x2.  Potential resistance with mycoplasma.  Consider moxifloxacin if positive testing.  RTC precautions  Need for Tdap vaccination - Plan: Tdap vaccine greater than or equal to 7yo IM   No orders of the defined types were placed in this encounter.  Patient Instructions   I will recheck testing as we discussed today.  If any return of discharge, burning or new symptoms with urination (especially if that testing is normal) let me know as other testing may be needed.   Return to the clinic or go to the nearest emergency room if any of your symptoms worsen or new symptoms occur.   If you have lab work done today you will be contacted with your lab results within the next 2 weeks.  If you have not heard from [place] then please contact us. The fastest way to get your results is to register for My Chart.   IF you received an x-ray today, you will receive an invoice from [facility]. Please contact [facility] at [phone] with questions or concerns regarding your invoice.   IF you received labwork today, you will receive an invoice from [place]. Please contact [facility] at [phone] with questions or concerns regarding your invoice.   Our billing staff will not be able to assist you with questions regarding bills from these companies.  You will be contacted with the lab results as soon as they are available. The fastest way to get your results is to activate your My  Chart account. Instructions are located on the last page of this paperwork. If you have not heard from [place] regarding the results in 2 weeks, please contact this office.         Signed, Merri Ray, MD [facility]

## 2019-09-13 ENCOUNTER — Encounter: Payer: Self-pay | Admitting: Family Medicine

## 2019-09-15 LAB — URINE CYTOLOGY ANCILLARY ONLY
Chlamydia: NEGATIVE
Comment: NEGATIVE
Comment: NEGATIVE
Comment: NORMAL
Neisseria Gonorrhea: NEGATIVE
Trichomonas: NEGATIVE

## 2019-09-18 LAB — MYCOPLASMA / UREAPLASMA CULTURE
Mycoplasma hominis Culture: NEGATIVE
Ureaplasma urealyticum: NEGATIVE

## 2019-10-27 ENCOUNTER — Ambulatory Visit: Payer: 59 | Attending: Internal Medicine

## 2019-10-27 DIAGNOSIS — Z20822 Contact with and (suspected) exposure to covid-19: Secondary | ICD-10-CM

## 2019-10-28 LAB — NOVEL CORONAVIRUS, NAA: SARS-CoV-2, NAA: NOT DETECTED

## 2020-06-03 ENCOUNTER — Encounter: Payer: Self-pay | Admitting: Emergency Medicine

## 2020-06-03 ENCOUNTER — Ambulatory Visit (INDEPENDENT_AMBULATORY_CARE_PROVIDER_SITE_OTHER): Payer: Self-pay | Admitting: Emergency Medicine

## 2020-06-03 ENCOUNTER — Ambulatory Visit (INDEPENDENT_AMBULATORY_CARE_PROVIDER_SITE_OTHER): Payer: Self-pay

## 2020-06-03 ENCOUNTER — Encounter: Payer: Self-pay | Admitting: *Deleted

## 2020-06-03 ENCOUNTER — Other Ambulatory Visit: Payer: Self-pay

## 2020-06-03 VITALS — BP 105/65 | HR 70 | Temp 98.7°F | Resp 16 | Ht 71.0 in | Wt 152.0 lb

## 2020-06-03 DIAGNOSIS — S62515A Nondisplaced fracture of proximal phalanx of left thumb, initial encounter for closed fracture: Secondary | ICD-10-CM

## 2020-06-03 DIAGNOSIS — M79642 Pain in left hand: Secondary | ICD-10-CM

## 2020-06-03 DIAGNOSIS — S6992XA Unspecified injury of left wrist, hand and finger(s), initial encounter: Secondary | ICD-10-CM
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
## 2020-06-03 NOTE — Patient Instructions (Addendum)
If you have lab work done today you will be contacted with your lab results within the next 2 weeks.  If you have not heard from [place] then please contact us. The fastest way to get your results is to register for My Chart.   IF you received an x-ray today, you will receive an invoice from [facility]. Please contact [facility] at [phone] with questions or concerns regarding your invoice.   IF you received labwork today, you will receive an invoice from [facility]. Please contact [facility] at [phone] with questions or concerns regarding your invoice.   Our billing staff will not be able to assist you with questions regarding bills from these companies.  You will be contacted with the lab results as soon as they are available. The fastest way to get your results is to activate your My Chart account. Instructions are located on the last page of this paperwork. If you have not heard from [place] regarding the results in 2 weeks, please contact this office.     Thumb Fracture  A thumb fracture is a break in one of the two bones in your thumb. The bone that goes from the tip of your thumb to the first joint in your thumb is called the distal phalanx. The bone that goes from the first joint to the joint at the base of your thumb is called the proximal phalanx. Fractures that happen at the joints of your thumb are harder to treat. A broken thumb is more serious than a break in one of your other fingers because you need your thumb for grasping. Thumb fractures are also more likely to lead to pain and stiffness years after healing (arthritis). What are the causes? A thumb fracture may be caused by:  A hard, direct hit to your thumb.  Your thumb being pulled out of place. What increases the risk? You may be more likely to break your thumb if you:  Participate in sports such as wrestling, hockey, football, or skiing.  Have a condition that causes your bones to become thin  and brittle (osteoporosis). What are the signs or symptoms? Symptoms may include:  Sudden severe pain.  Swelling.  Bruising.  Not being able to move the thumb.  An abnormal shape of the thumb (deformity).  Numbness or coldness.  A red, black, or blue thumbnail. How is this diagnosed? This condition may be diagnosed based on:  Your symptoms and medical history.  A physical exam.  Imaging studies such as X-ray, ultrasound, or MRI. How is this treated? Treatment for this condition depends on how severe your fracture is.  At first, you may need to wear a padded splint until you can get a cast or have surgery. The padded splint protects your thumb and keeps it from moving.  If the broken pieces of your bone line up with each other (good alignment), you will need to wear a splint or a cast for up to 4-6 weeks.  If your fracture is severe, your health care provider will need to align the bone pieces. Your health care provider may: ? Move the bones back into position without surgery (closed reduction). ? Do surgery to align the fracture and put in metal screws, plates, or wires to hold the bone pieces in place (open reduction and internal fixation, ORIF). ? Do surgery to align the fracture and put in pins that are attached to a stabilizing bar outside your skin to hold the bone pieces  in place (external fixation).  In all cases, treatment may involve: ? Wearing a splint or cast for up to 6 weeks. ? Follow-up visits with your health care provider and X-rays to make sure you are healing. ? Physical therapy after your cast is removed. Follow these instructions at home: If you have a splint:  Wear the splint as told by your health care provider. Remove it only as told by your health care provider.  Loosen the splint if your thumb or fingers tingle, become numb, or turn cold and blue.  Keep the splint clean and dry. If you have a cast:  Do not stick anything inside the cast to  scratch your skin. Doing that increases your risk of infection.  Check the skin around the cast every day. Tell your health care provider about any concerns.  You may put lotion on dry skin around the edges of the cast. Do not put lotion on the skin underneath the cast.  Keep the cast clean and dry. Bathing  Do not take baths, swim, or use a hot tub until your health care provider approves. Ask your health care provider if you may take showers. You may only be allowed to take sponge baths.  If your splint or cast is not waterproof: ? Do not let it get wet. ? Cover it with a watertight covering to protect it from water when you take a bath or shower. Managing pain, stiffness, and swelling   If directed, put ice on your thumb: ? If you have a removable splint, remove it as told by your health care provider. ? Put ice in a plastic bag. ? Place a towel between your skin and the bag, or between your cast and the bag. ? Leave the ice on for 20 minutes, 2-3 times a day.  Raise (elevate) your hand above the level of your heart while you are sitting or lying down. Activity  Ask your health care provider what activities are safe for you.  After your cast is removed, do physical therapy exercises as directed. Your health care provider may recommend that you: ? Move your thumb in circles. ? Touch your thumb to your pinky finger. ? Do these exercises several times a day.  Ask your health care provider if you may use a hand exerciser to strengthen your muscles.  If your thumb feels stiff while you are exercising it, try doing the exercises while soaking your hand in warm water. General instructions  Do not put pressure on any part of the cast or splint until it is fully hardened, if applicable. This may take several hours.  Take over-the-counter and prescription medicines only as told by your health care provider.  Do not drive until your health care provider approves. Do not drive or use  heavy machinery while taking prescription pain medicine.  Do not use any products that contain nicotine or tobacco, such as cigarettes and e-cigarettes. These can delay bone healing. If you need help quitting, ask your health care provider.  Keep all follow-up visits as told by your health care provider. This is important. Contact a health care provider if you have:  Pain that gets worse.  A fever.  A bad smell coming from your cast or splint. Get help right away if:  Your thumb feels numb, tingles, turns cold, or turns blue.  You have redness or swelling that gets worse.  You have severe pain. Summary  A thumb fracture is a break in one  of the two bones in your thumb.  Treatment involves wearing a splint or cast to keep the thumb from moving until it heals. Sometimes surgery is needed.  Make sure you understand and follow all of your health care provider's home care instructions. This information is not intended to replace advice given to you by your health care provider. Make sure you discuss any questions you have with your health care provider. Document Revised: 12/17/2018 Document Reviewed: 09/13/2017 Elsevier Patient Education  2020 [employer].

## 2020-06-04 ENCOUNTER — Telehealth: Payer: Self-pay | Admitting: Emergency Medicine

## 2020-06-04 NOTE — Telephone Encounter (Signed)
Pt dropped off a [employer] for Duty form for Dr. Alvy Bimler to fill out. Pt would like a call when paperwork is complete, he will pick up. I have placed form at nurse's station in his box. CB [phone].

## 2020-06-04 NOTE — Telephone Encounter (Signed)
Placed this in Dr Darlin Drop box to look over and sign.

## 2020-06-07 ENCOUNTER — Telehealth: Payer: Self-pay | Admitting: *Deleted

## 2020-06-07 NOTE — Telephone Encounter (Signed)
Patient is calling to follow up on this request

## 2020-06-07 NOTE — Telephone Encounter (Signed)
Spoke to patient paper work for his job is ready for pick up at check in.

## 2020-06-17 ENCOUNTER — Ambulatory Visit (INDEPENDENT_AMBULATORY_CARE_PROVIDER_SITE_OTHER): Payer: Self-pay | Admitting: Physician Assistant

## 2020-06-17 ENCOUNTER — Ambulatory Visit: Payer: Self-pay

## 2020-06-17 ENCOUNTER — Encounter: Payer: Self-pay | Admitting: Physician Assistant

## 2020-06-17 DIAGNOSIS — S62515A Nondisplaced fracture of proximal phalanx of left thumb, initial encounter for closed fracture: Secondary | ICD-10-CM
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
## 2020-06-22 ENCOUNTER — Ambulatory Visit (INDEPENDENT_AMBULATORY_CARE_PROVIDER_SITE_OTHER): Payer: Self-pay | Admitting: Orthopaedic Surgery

## 2020-06-22 ENCOUNTER — Ambulatory Visit: Payer: Self-pay

## 2020-06-22 ENCOUNTER — Encounter: Payer: Self-pay | Admitting: Orthopaedic Surgery

## 2020-06-22 DIAGNOSIS — S62515A Nondisplaced fracture of proximal phalanx of left thumb, initial encounter for closed fracture: Secondary | ICD-10-CM

## 2020-06-22 NOTE — Progress Notes (Addendum)
Office Visit Note   Patient: Seth Weber           Date of Birth: [birth year]           MRN: [id number] Visit Date: 06/22/2020              Requested by: No referring provider defined for this encounter. PCP: Patient, No Pcp Per   Assessment & Plan: Visit Diagnoses:  1. Closed nondisplaced fracture of proximal phalanx of left thumb, initial encounter     Plan: Impression is stable left thumb proximal phalanx avulsion fracture with stable UCL.  Based on findings I have recommended continued conservative treatment in a thumb spica brace.  We will keep him out of work for another 3 weeks as there is no light duty.  Recheck in 3 weeks.  Follow-Up Instructions: Return in about 3 weeks (around 07/13/2020).   Orders:  Orders Placed This Encounter  Procedures  . XR Finger Thumb Left   No orders of the defined types were placed in this encounter.     Procedures: No procedures performed   Clinical Data: No additional findings.   Subjective: Chief Complaint  Patient presents with  . Left Thumb - Pain    Seth Weber is a very nice 24 year old gentleman referral from Dr. Magnus Ivan and Rexene Edison for evaluation of avulsion fracture of the proximal phalanx of the left thumb concerning for ulnar collateral ligament injury.  He states that overall he has felt better.  His injury was 06/03/2020.  He has been wearing a thumb spica brace.  The swelling has gone down.  He is right-hand dominant works at [employer] center.  Denies any numbness and tingling.   Review of Systems  Constitutional: Negative.   All other systems reviewed and are negative.    Objective: Vital Signs: There were no vitals taken for this visit.  Physical Exam Vitals and nursing note reviewed.  Constitutional:      Appearance: He is well-developed.  Pulmonary:     Effort: Pulmonary effort is normal.  Abdominal:     Palpations: Abdomen is soft.  Skin:    General: Skin is warm.  Neurological:      Mental Status: He is alert and oriented to person, place, and time.  Psychiatric:        Behavior: Behavior normal.        Thought Content: Thought content normal.        Judgment: Judgment normal.     Ortho Exam Left thumb shows mild swelling and tenderness at the ulnar aspect of the MCP joint.  UCL testing shows good firm endpoint with moderate pain distress.  RCL testing is normal.  Flexion is limited as expected.  No neurovascular compromise. Specialty Comments:  No specialty comments available.  Imaging: XR Finger Thumb Left  Result Date: 06/22/2020 Stable appearance of proximal phalanx avulsion fracture.  Congruent joint.    PMFS History: Patient Active Problem List   Diagnosis Date Noted  . Closed nondisplaced fracture of proximal phalanx of left thumb 06/03/2020   History reviewed. No pertinent past medical history.  History reviewed. No pertinent family history.  History reviewed. No pertinent surgical history. Social History   Occupational History  . Not on file  Tobacco Use  . Smoking status: Never Smoker  . Smokeless tobacco: Never Used  Substance and Sexual Activity  . Alcohol use: Yes    Comment: all above  . Drug use: Never  . Sexual activity: Not on  file

## 2020-07-05 ENCOUNTER — Telehealth: Payer: Self-pay

## 2020-07-05 NOTE — Telephone Encounter (Signed)
yes

## 2020-07-05 NOTE — Telephone Encounter (Signed)
Note ready for pick up 

## 2020-07-05 NOTE — Telephone Encounter (Signed)
Patient called he is requesting a work note that states he is released from our office and is clear to return to work. He will come pick it up. Call back:[phone]

## 2020-11-25 ENCOUNTER — Ambulatory Visit (HOSPITAL_COMMUNITY)
Admission: EM | Admit: 2020-11-25 | Discharge: 2020-11-25 | Disposition: A | Discharge status: Home / Self Care | Payer: 59 | Attending: Emergency Medicine | Admitting: Emergency Medicine

## 2020-11-25 ENCOUNTER — Ambulatory Visit (HOSPITAL_COMMUNITY): Payer: Self-pay

## 2020-11-25 ENCOUNTER — Other Ambulatory Visit: Payer: Self-pay

## 2020-11-25 ENCOUNTER — Ambulatory Visit (INDEPENDENT_AMBULATORY_CARE_PROVIDER_SITE_OTHER): Payer: 59

## 2020-11-25 ENCOUNTER — Encounter (HOSPITAL_COMMUNITY): Payer: Self-pay

## 2020-11-25 DIAGNOSIS — S62502A Fracture of unspecified phalanx of left thumb, initial encounter for closed fracture: Secondary | ICD-10-CM

## 2020-11-25 DIAGNOSIS — M79645 Pain in left finger(s): Secondary | ICD-10-CM | POA: Diagnosis not present

## 2020-11-25 DIAGNOSIS — S6992XA Unspecified injury of left wrist, hand and finger(s), initial encounter: Secondary | ICD-10-CM | POA: Diagnosis not present

## 2020-11-25 DIAGNOSIS — W19XXXA Unspecified fall, initial encounter: Secondary | ICD-10-CM

## 2020-11-25 NOTE — ED Provider Notes (Signed)
[facility]    CSN: [id number] Arrival date & time: 11/25/20  1301      History   Chief Complaint Chief Complaint  Patient presents with  . Thumb Injury    HPI Seth Weber is a 25 y.o. male.   Seth Weber is a 25 y.o. here with chief complaint of left thumb pain and swelling.  Reports falling on it on Monday and having pain and swelling since.  Reports history of fracture to the left thumb last year.  Has tried using a thumb splint with minimal relief.  No OTC medications.  Movement makes symptoms worse.   Denies any fevers, chest pain, shortness of breath, N/V/D, abdominal pain, or headaches.    ROS: As per HPI, all other pertinent ROS negative         History reviewed. No pertinent past medical history.  Patient Active Problem List   Diagnosis Date Noted  . Closed nondisplaced fracture of proximal phalanx of left thumb 06/03/2020    History reviewed. No pertinent surgical history.     Home Medications    Prior to Admission medications   Not on File    Family History History reviewed. No pertinent family history.  Social History Social History   Tobacco Use  . Smoking status: Never Smoker  . Smokeless tobacco: Never Used  Substance Use Topics  . Alcohol use: Yes    Comment: all above  . Drug use: Never     Allergies   Patient has no known allergies.   Review of Systems Review of Systems  Musculoskeletal: Positive for joint swelling.     Physical Exam Triage Vital Signs ED Triage Vitals  Enc Vitals Group     BP 11/25/20 1338 118/69     Pulse Rate 11/25/20 1338 61     Resp 11/25/20 1338 18     Temp 11/25/20 1338 98.1 F (36.7 C)     Temp Source 11/25/20 1338 Oral     SpO2 11/25/20 1338 96 %     Weight --      Height --      Head Circumference --      Peak Flow --      Pain Score 11/25/20 1339 9     Pain Loc --      Pain Edu? --      Excl. in GC? --    No data found.  Updated Vital Signs BP 118/69  (BP Location: Right Arm)   Pulse 61   Temp 98.1 F (36.7 C) (Oral)   Resp 18   SpO2 96%   Visual Acuity Right Eye Distance:   Left Eye Distance:   Bilateral Distance:    Right Eye Near:   Left Eye Near:    Bilateral Near:     Physical Exam Vitals and nursing note reviewed.  Constitutional:      General: He is not in acute distress.    Appearance: Normal appearance. He is not ill-appearing, toxic-appearing or diaphoretic.  HENT:     Head: Normocephalic and atraumatic.  Eyes:     Conjunctiva/sclera: Conjunctivae normal.  Cardiovascular:     Rate and Rhythm: Normal rate.     Pulses: Normal pulses.  Pulmonary:     Effort: Pulmonary effort is normal.  Abdominal:     General: Abdomen is flat.  Musculoskeletal:     Right hand: Normal.     Left hand: Swelling (left thumb), deformity, tenderness and bony tenderness present.  Decreased range of motion. Normal capillary refill. Normal pulse.     Cervical back: Normal range of motion.  Skin:    General: Skin is warm and dry.  Neurological:     General: No focal deficit present.     Mental Status: He is alert and oriented to person, place, and time.  Psychiatric:        Mood and Affect: Mood normal.      UC Treatments / Results  Labs (all labs ordered are listed, but only abnormal results are displayed) Labs Reviewed - No data to display  EKG   Radiology DG Finger Thumb Left  Result Date: 11/25/2020 CLINICAL DATA:  Pain after fall EXAM: LEFT THUMB 2+V COMPARISON:  June 22, 2020 FINDINGS: Frontal, oblique, and lateral views were obtained. There is an avulsion along the medial, volar aspect of the proximal most aspect of the first proximal phalanx. Note that patient has had previous fracture in this area. Appearance raises question of potential re-injury in this area. There is an acute fracture of the proximal metaphysis of the first metacarpal with mild impaction in this area. No other fractures no dislocation. Joint  spaces appear normal. No erosive change. IMPRESSION: 1. Acute fracture along the proximal metaphysis of the first metacarpal with mild impaction in this area. 2. Fracture along the medial, volar aspect of the proximal portion of the first proximal phalanx at the site of previous injury. Question re-injury in this area. Clinical assessment of this area advised. 3.  No dislocation.  No appreciable arthropathy. These results will be called to the ordering clinician or representative by the Radiologist Assistant, and communication documented in the PACS or [employer]. Electronically Signed   By: Bretta Bang III M.D.   On: 11/25/2020 14:41    Procedures Procedures (including critical care time)  Medications Ordered in UC Medications - No data to display  Initial Impression / Assessment and Plan / UC Course  I have reviewed the triage vital signs and the nursing notes.  Pertinent labs & imaging results that were available during my care of the patient were reviewed by me and considered in my medical decision making (see chart for details).    Left thumb fracture Left Thumb injury Left thumb pain Xray showed fracture along the proximal metaphysis of the first metacarpal and fracture along the medial, volar aspect of the proximal portion of the first phalanx. Patient did injury left thumb at the first phalanx last year.   Patient declined thumb spica in preference for his thumb splint.  Follow up with ortho in the next few days.   RICE and ibuprofen as needed for pain  Final Clinical Impressions(s) / UC Diagnoses   Final diagnoses:  Injury of left thumb, initial encounter  Closed nondisplaced fracture of phalanx of left thumb, unspecified phalanx, initial encounter  Pain of left thumb     Discharge Instructions     Wear the thumb splint.  Use the RICE method RICE: Rest Ice for 10-15 minutes every 4-6 hours as needed for pain and swelling Compression (thumb splint) Elevate  about your heart  Follow up with Orthopedics in the next few days.      ED Prescriptions    None     PDMP not reviewed this encounter.   Ivette Loyal, NP 11/25/20 1453

## 2020-11-25 NOTE — ED Triage Notes (Signed)
Pt presents with left thumb injury after falling on it on Monday.

## 2020-11-25 NOTE — ED Notes (Signed)
Pt wants to use the wrist brace he owns. Ether Griffins, NP, notified.

## 2020-11-25 NOTE — Discharge Instructions (Addendum)
Wear the thumb splint.  Use the RICE method RICE: Rest Ice for 10-15 minutes every 4-6 hours as needed for pain and swelling Compression (thumb splint) Elevate about your heart  Follow up with Orthopedics in the next few days.

## 2020-12-02 ENCOUNTER — Ambulatory Visit (INDEPENDENT_AMBULATORY_CARE_PROVIDER_SITE_OTHER): Payer: 59 | Admitting: Orthopaedic Surgery

## 2020-12-02 ENCOUNTER — Encounter: Payer: Self-pay | Admitting: Orthopaedic Surgery

## 2020-12-02 VITALS — Ht 71.0 in | Wt 152.0 lb

## 2020-12-02 DIAGNOSIS — M79645 Pain in left finger(s): Secondary | ICD-10-CM
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
## 2020-12-21 ENCOUNTER — Telehealth: Payer: Self-pay | Admitting: Orthopaedic Surgery

## 2020-12-21 NOTE — Telephone Encounter (Signed)
He was supposed to follow-up with [place] but it looks like he has not.  Please have somebody reach out to him for follow-up appointment.  In terms of the end date for restrictions that is yet to be determined.

## 2020-12-21 NOTE — Telephone Encounter (Signed)
Please advise of end date

## 2020-12-21 NOTE — Telephone Encounter (Signed)
Christie from [facility] department at [employer] called for work note clarification. Asking when end date of restrictions for pt. Please call Lorene Dy at [phone] ext [phone]

## 2020-12-22 NOTE — Telephone Encounter (Signed)
Pt has f/u on 4/26. LVM for them informing of above

## 2020-12-28 ENCOUNTER — Ambulatory Visit (INDEPENDENT_AMBULATORY_CARE_PROVIDER_SITE_OTHER): Payer: 59

## 2020-12-28 ENCOUNTER — Other Ambulatory Visit: Payer: Self-pay

## 2020-12-28 ENCOUNTER — Ambulatory Visit (INDEPENDENT_AMBULATORY_CARE_PROVIDER_SITE_OTHER): Payer: 59 | Admitting: Orthopaedic Surgery

## 2020-12-28 DIAGNOSIS — S62212D Bennett's fracture, left hand, subsequent encounter for fracture with routine healing: Secondary | ICD-10-CM

## 2020-12-28 DIAGNOSIS — S62515A Nondisplaced fracture of proximal phalanx of left thumb, initial encounter for closed fracture: Secondary | ICD-10-CM

## 2020-12-28 NOTE — Progress Notes (Signed)
   Office Visit Note   Patient: Seth Weber           Date of Birth: 1996/07/09           MRN: [id number] Visit Date: 12/28/2020              Requested by: No referring provider defined for this encounter. PCP: Patient, No Pcp Per (Inactive)   Assessment & Plan: Visit Diagnoses:  1. Closed Bennett's fracture of left thumb with routine healing, subsequent encounter   2. Closed nondisplaced fracture of proximal phalanx of left thumb, initial encounter     Plan: Impression is healed left thumb Bennett's and stable nonunited proximal phalanx fractures with UCL sprain.  Collateral ligament is stable.  Based on discussion it sounds like his symptoms have essentially returned back to baseline.  We will allow the patient to advance with activity as tolerated.  He will follow-up with [place] as needed.  Follow-Up Instructions: Return if symptoms worsen or fail to improve.   Orders:  Orders Placed This Encounter  Procedures  . XR Hand Complete Left   No orders of the defined types were placed in this encounter.     Procedures: No procedures performed   Clinical Data: No additional findings.   Subjective: Chief Complaint  Patient presents with  . Left Hand - Follow-up    HPI patient is a pleasant 25 year old gentleman who comes in today with continued left thumb pain.  He is 6 weeks status post left thumb Bennett's fracture in addition to an old proximal phalanx fracture.  He has been wearing a long thumb spica splint.  He notes his pain is slightly improved but is better while wearing the splint.  The pain is primarily to the proximal phalanx.  He denies any paresthesias.     Objective: Vital Signs: There were no vitals taken for this visit.    Ortho Exam left thumb shows minimal tenderness of proximal metacarpal.  Moderate tenderness to the ulnar base of the proximal phalanx.  He has full range of motion.  He has minimal laxity with UCL stress and has a firm endpoint.   He is neurovascular intact distally.  Specialty Comments:  No specialty comments available.  Imaging: XR Hand Complete Left  Result Date: 12/28/2020 X-rays of the left hand demonstrate a healed Bennetts fracture.  Unchanged proximal phalanx fracture from previous x-ray.    PMFS History: Patient Active Problem List   Diagnosis Date Noted  . Closed nondisplaced fracture of proximal phalanx of left thumb 06/03/2020   No past medical history on file.  No family history on file.  No past surgical history on file. Social History   Occupational History  . Not on file  Tobacco Use  . Smoking status: Never Smoker  . Smokeless tobacco: Never Used  Substance and Sexual Activity  . Alcohol use: Yes    Comment: all above  . Drug use: Never  . Sexual activity: Not on file

## 2021-01-05 ENCOUNTER — Telehealth: Payer: Self-pay

## 2021-01-05 NOTE — Telephone Encounter (Signed)
Work note sent through mychart 

## 2021-01-05 NOTE — Telephone Encounter (Signed)
Called Patient. He is aware.

## 2021-01-05 NOTE — Telephone Encounter (Signed)
yes

## 2021-01-05 NOTE — Telephone Encounter (Signed)
Pt would like a return to work note.  And he would like it to be sent to him via [employer]

## 2021-01-07 ENCOUNTER — Telehealth: Payer: Self-pay

## 2021-01-07 ENCOUNTER — Telehealth: Payer: Self-pay | Admitting: Orthopaedic Surgery

## 2021-01-07 NOTE — Telephone Encounter (Signed)
Okay to say with no restrictions?

## 2021-01-07 NOTE — Telephone Encounter (Signed)
FAXED

## 2021-01-07 NOTE — Telephone Encounter (Signed)
Patient called regarding his work note patient stated he cant see the work note in my chart call back:[phone]

## 2021-01-07 NOTE — Telephone Encounter (Signed)
Patient called back stating that his employer to resend a revised return to work note stating with no restrictions. Please re fax to [employer]. Patient phone number is [phone].

## 2021-01-07 NOTE — Telephone Encounter (Signed)
ALSO SENT THROUGH MY CHART

## 2021-01-07 NOTE — Telephone Encounter (Signed)
Updated note made.

## 2021-01-07 NOTE — Telephone Encounter (Signed)
yes

## 2021-04-15 ENCOUNTER — Ambulatory Visit
Admission: RE | Admit: 2021-04-15 | Discharge: 2021-04-15 | Disposition: A | Discharge status: Home / Self Care | Payer: 59 | Source: Ambulatory Visit | Attending: Emergency Medicine | Admitting: Emergency Medicine

## 2021-04-15 ENCOUNTER — Other Ambulatory Visit: Payer: Self-pay

## 2021-04-15 VITALS — BP 116/75 | HR 84 | Temp 98.7°F | Resp 16

## 2021-04-15 DIAGNOSIS — J039 Acute tonsillitis, unspecified: Secondary | ICD-10-CM | POA: Diagnosis not present

## 2021-04-15 LAB — POCT RAPID STREP A (OFFICE): Rapid Strep A Screen: NEGATIVE

## 2021-04-15 LAB — POCT MONO SCREEN (KUC): Mono, POC: NEGATIVE

## 2021-04-15 MED ORDER — IBUPROFEN 800 MG PO TABS: 800.0000 mg | ORAL_TABLET | Freq: Three times a day (TID) | ORAL | 0 refills | Status: AC

## 2021-04-15 MED ORDER — AMOXICILLIN-POT CLAVULANATE 875-125 MG PO TABS: 1.0000 | ORAL_TABLET | Freq: Two times a day (BID) | ORAL | 0 refills | Status: AC

## 2021-04-15 NOTE — ED Triage Notes (Signed)
Patient presents to Urgent Care with complaints of sore throat and right ear pain since yesterday. Treating symptoms with tylenol.   Denies fever.

## 2021-04-15 NOTE — Discharge Instructions (Addendum)
Strep negative, mono negative, COVID pending Begin Augmentin twice daily x10 days to treat for tonsillitis Tylenol and ibuprofen for pain and swelling Salt water gargles Please continue to monitor symptoms, follow-up if not improving or worsening despite use of the above

## 2021-04-15 NOTE — ED Provider Notes (Signed)
[facility]    CSN: [id number] Arrival date & time: 04/15/21  1054      History   Chief Complaint Chief Complaint  Patient presents with   Appointment    1045   Sore Throat    HPI Seth Weber is a 25 y.o. male presenting today for evaluation of sore throat.  Reports sore throat and right ear pain over the past couple days which has progressively worsened.  Using Tylenol without full relief.  Denies fevers.  Denies any significant associated cough or congestion.  Denies history of mono.  HPI  History reviewed. No pertinent past medical history.  Patient Active Problem List   Diagnosis Date Noted   Closed nondisplaced fracture of proximal phalanx of left thumb 06/03/2020    History reviewed. No pertinent surgical history.     Home Medications    Prior to Admission medications   Medication Sig Start Date End Date Taking? Authorizing Provider  amoxicillin-clavulanate (AUGMENTIN) 875-125 MG tablet Take 1 tablet by mouth every 12 (twelve) hours for 10 days. 04/15/21 04/25/21 Yes Rozanna Cormany C, PA-C  ibuprofen (ADVIL) 800 MG tablet Take 1 tablet (800 mg total) by mouth 3 (three) times daily. 04/15/21  Yes Ayshia Gramlich, Junius Creamer, PA-C    Family History History reviewed. No pertinent family history.  Social History Social History   Tobacco Use   Smoking status: Never   Smokeless tobacco: Never  Substance Use Topics   Alcohol use: Yes    Comment: all above   Drug use: Never     Allergies   Patient has no known allergies.   Review of Systems Review of Systems  Constitutional:  Negative for activity change, appetite change, chills, fatigue and fever.  HENT:  Positive for sore throat. Negative for congestion, ear pain, rhinorrhea, sinus pressure and trouble swallowing.   Eyes:  Negative for discharge and redness.  Respiratory:  Negative for cough, chest tightness and shortness of breath.   Cardiovascular:  Negative for chest pain.   Gastrointestinal:  Negative for abdominal pain, diarrhea, nausea and vomiting.  Musculoskeletal:  Negative for myalgias.  Skin:  Negative for rash.  Neurological:  Negative for dizziness, light-headedness and headaches.    Physical Exam Triage Vital Signs ED Triage Vitals  Enc Vitals Group     BP 04/15/21 1135 116/75     Pulse Rate 04/15/21 1135 84     Resp 04/15/21 1135 16     Temp 04/15/21 1135 98.7 F (37.1 C)     Temp Source 04/15/21 1135 Oral     SpO2 04/15/21 1135 96 %     Weight --      Height --      Head Circumference --      Peak Flow --      Pain Score 04/15/21 1132 9     Pain Loc --      Pain Edu? --      Excl. in GC? --    No data found.  Updated Vital Signs BP 116/75 (BP Location: Right Arm)   Pulse 84   Temp 98.7 F (37.1 C) (Oral)   Resp 16   SpO2 96%   Visual Acuity Right Eye Distance:   Left Eye Distance:   Bilateral Distance:    Right Eye Near:   Left Eye Near:    Bilateral Near:     Physical Exam Vitals and nursing note reviewed.  Constitutional:      Appearance: He is well-developed.  Comments: No acute distress  HENT:     Head: Normocephalic and atraumatic.     Ears:     Comments: Bilateral ears without tenderness to palpation of external auricle, tragus and mastoid, EAC's without erythema or swelling, TM's with good bony landmarks and cone of light. Non erythematous.      Nose: Nose normal.     Mouth/Throat:     Comments: Bilateral tonsils erythematous and moderately swollen, right tonsil with multiple pockets of pus noted, no soft palate swelling Eyes:     Conjunctiva/sclera: Conjunctivae normal.  Neck:     Comments: Lymphadenopathy noted to bilateral tonsillar area, anterior cervical chain and posterior cervical chain Cardiovascular:     Rate and Rhythm: Normal rate and regular rhythm.  Pulmonary:     Effort: Pulmonary effort is normal. No respiratory distress.     Comments: Breathing comfortably at rest, CTABL, no  wheezing, rales or other adventitious sounds auscultated   Abdominal:     General: There is no distension.  Musculoskeletal:        General: Normal range of motion.     Cervical back: Neck supple.  Skin:    General: Skin is warm and dry.  Neurological:     Mental Status: He is alert and oriented to person, place, and time.     UC Treatments / Results  Labs (all labs ordered are listed, but only abnormal results are displayed) Labs Reviewed  NOVEL CORONAVIRUS, NAA  CULTURE, GROUP A STREP [facility])  POCT RAPID STREP A (OFFICE)  POCT MONO SCREEN ([facility])    EKG   Radiology No results found.  Procedures Procedures (including critical care time)  Medications Ordered in UC Medications - No data to display  Initial Impression / Assessment and Plan / UC Course  I have reviewed the triage vital signs and the nursing notes.  Pertinent labs & imaging results that were available during my care of the patient were reviewed by me and considered in my medical decision making (see chart for details).    Tonsillitis-strep negative, mono negative, giving appearance of tonsils and associated lymphadenopathy the opting to proceed with antibiotic therapy, initiating on Augmentin, continue symptomatic and supportive care as well and close monitoring.  Discussed strict return precautions. Patient verbalized understanding and is agreeable with plan.  Final Clinical Impressions(s) / UC Diagnoses   Final diagnoses:  Acute tonsillitis, unspecified etiology     Discharge Instructions      Strep negative, mono negative, COVID pending Begin Augmentin twice daily x10 days to treat for tonsillitis Tylenol and ibuprofen for pain and swelling Salt water gargles Please continue to monitor symptoms, follow-up if not improving or worsening despite use of the above     ED Prescriptions     Medication Sig Dispense Auth. Provider   amoxicillin-clavulanate (AUGMENTIN) 875-125 MG tablet Take 1  tablet by mouth every 12 (twelve) hours for 10 days. 20 tablet Elleanna Melling C, PA-C   ibuprofen (ADVIL) 800 MG tablet Take 1 tablet (800 mg total) by mouth 3 (three) times daily. 21 tablet Kady Toothaker, Florin C, PA-C      PDMP not reviewed this encounter.   Lew Dawes, [place] 04/16/21 1047

## 2021-04-17 LAB — SARS-COV-2, NAA 2 DAY TAT

## 2021-04-17 LAB — NOVEL CORONAVIRUS, NAA: SARS-CoV-2, NAA: DETECTED — AB

## 2021-04-18 LAB — CULTURE, GROUP A STREP (THRC)

## 2021-05-02 ENCOUNTER — Telehealth: Payer: 59 | Admitting: Family

## 2021-05-02 DIAGNOSIS — R369 Urethral discharge, unspecified: Secondary | ICD-10-CM | POA: Diagnosis not present

## 2021-05-02 DIAGNOSIS — R3915 Urgency of urination: Secondary | ICD-10-CM

## 2021-05-02 NOTE — Progress Notes (Signed)
Virtual Visit Consent   Seth Weber, you are scheduled for a virtual visit with a [facility] provider today.     Just as with appointments in the office, your consent must be obtained to participate.  Your consent will be active for this visit and any virtual visit you may have with one of our providers in the next 365 days.     If you have a MyChart account, a copy of this consent can be sent to you electronically.  All virtual visits are billed to your insurance company just like a traditional visit in the office.    As this is a virtual visit, video technology does not allow for your provider to perform a traditional examination.  This may limit your provider's ability to fully assess your condition.  If your provider identifies any concerns that need to be evaluated in person or the need to arrange testing (such as labs, EKG, etc.), we will make arrangements to do so.     Although advances in technology are sophisticated, we cannot ensure that it will always work on either your end or our end.  If the connection with a video visit is poor, the visit may have to be switched to a telephone visit.  With either a video or telephone visit, we are not always able to ensure that we have a secure connection.     I need to obtain your verbal consent now.   Are you willing to proceed with your visit today?    ESSIE GEHRET has provided verbal consent on 05/02/2021 for a virtual visit (video or telephone).   Jannifer Rodney, FNP   Date: 05/02/2021 7:27 PM   Virtual Visit via Video Note   I, Jannifer Rodney, connected with  Seth Weber  ([id number], March 24, 1996) on 05/02/21 at  7:30 PM EDT by a video-enabled telemedicine application and verified that I am speaking with the correct person using two identifiers.  Location: Patient: Virtual Visit Location Patient: Home Provider: Virtual Visit Location Provider: Home   I discussed the limitations of evaluation and management by  telemedicine and the availability of in person appointments. The patient expressed understanding and agreed to proceed.    History of Present Illness: Seth Weber is a 25 y.o. who identifies as a male who was assigned male at birth, and is being seen today for urgency and clear discharge from his penis. He reports he was treated for acute tonsillitis and given Augmentin.   Denies fever, dysuria, urinary frequency, or hematuria.    He does report he has have unprotected sex recently.   HPI: HPI  Problems:  Patient Active Problem List   Diagnosis Date Noted   Closed nondisplaced fracture of proximal phalanx of left thumb 06/03/2020    Allergies: No Known Allergies Medications:  Current Outpatient Medications:    ibuprofen (ADVIL) 800 MG tablet, Take 1 tablet (800 mg total) by mouth 3 (three) times daily., Disp: 21 tablet, Rfl: 0  Observations/Objective: Patient is well-developed, well-nourished in no acute distress.  Resting comfortably  at home.  Head is normocephalic, atraumatic.  No labored breathing.  Speech is clear and coherent with logical content.  Patient is alert and oriented at baseline.    Assessment and Plan: 1. Penile discharge  2. Urinary urgency Given discharge and recent unprotected sex, he needs to be seen in person for further STD testing to rule out Safe sex  Follow Up Instructions: I discussed the assessment and treatment  plan with the patient. The patient was provided an opportunity to ask questions and all were answered. The patient agreed with the plan and demonstrated an understanding of the instructions.  A copy of instructions were sent to the patient via MyChart.  The patient was advised to call back or seek an in-person evaluation if the symptoms worsen or if the condition fails to improve as anticipated.  Time:  I spent 12 minutes with the patient via telehealth technology discussing the above problems/concerns.    Jannifer Rodney, FNP

## 2021-05-03 ENCOUNTER — Ambulatory Visit
Admission: RE | Admit: 2021-05-03 | Discharge: 2021-05-03 | Disposition: A | Discharge status: Home / Self Care | Payer: 59 | Source: Ambulatory Visit | Attending: Emergency Medicine | Admitting: Emergency Medicine

## 2021-05-03 ENCOUNTER — Other Ambulatory Visit: Payer: Self-pay

## 2021-05-03 VITALS — BP 117/78 | HR 64 | Temp 97.9°F | Resp 18

## 2021-05-03 DIAGNOSIS — R369 Urethral discharge, unspecified: Secondary | ICD-10-CM | POA: Diagnosis present

## 2021-05-03 MED ORDER — CEFTRIAXONE SODIUM 500 MG IJ SOLR
500.0000 mg | Freq: Once | INTRAMUSCULAR | Status: AC
Start: 2021-05-03 — End: 2021-05-03
  Administered 2021-05-03: 500 mg via INTRAMUSCULAR

## 2021-05-03 MED ORDER — DOXYCYCLINE HYCLATE 100 MG PO CAPS: 100.0000 mg | ORAL_CAPSULE | Freq: Two times a day (BID) | ORAL | 0 refills | Status: AC

## 2021-05-03 NOTE — Discharge Instructions (Addendum)
We gave you a shot of Rocephin to treat gonorrhea, please pick up doxycycline from the pharmacy and take twice daily for the next week to cover chlamydia.  No intercourse x10 days.  We are testing you for Gonorrhea, Chlamydia and Trichomonas. We will call you if anything is positive and let you know if you require any further treatment. Please inform partner of any positive results.  Please return if symptoms not improving with treatment, development of fever, nausea, vomiting, abdominal pain, scrotal pain.

## 2021-05-03 NOTE — ED Provider Notes (Signed)
[facility]    CSN: [id number] Arrival date & time: 05/03/21  1210      History   Chief Complaint Chief Complaint  Patient presents with   SEXUALLY TRANSMITTED DISEASE    HPI Seth Weber is a 25 y.o. male presenting today for evaluation penile discharge.  Reports over the past week he has developed clear discharge and sensation of moisture on the tip of his penis.  Denies any dysuria or tingling or any changes in urination.  Denies testicle swelling.  Denies any rashes or lesions.  Does report new partner recently.  HPI  History reviewed. No pertinent past medical history.  Patient Active Problem List   Diagnosis Date Noted   Closed nondisplaced fracture of proximal phalanx of left thumb 06/03/2020    History reviewed. No pertinent surgical history.     Home Medications    Prior to Admission medications   Medication Sig Start Date End Date Taking? Authorizing Provider  amoxicillin-clavulanate (AUGMENTIN) 875-125 MG tablet Take 1 tablet by mouth 2 (two) times daily.   Yes [provider]  doxycycline (VIBRAMYCIN) 100 MG capsule Take 1 capsule (100 mg total) by mouth 2 (two) times daily for 7 days. 05/03/21 05/10/21 Yes Wyonia Fontanella C, PA-C  ibuprofen (ADVIL) 800 MG tablet Take 1 tablet (800 mg total) by mouth 3 (three) times daily. 04/15/21   Latrenda Irani, Junius Creamer, PA-C    Family History History reviewed. No pertinent family history.  Social History Social History   Tobacco Use   Smoking status: Never   Smokeless tobacco: Never  Substance Use Topics   Alcohol use: Yes    Comment: all above   Drug use: Never     Allergies   Patient has no known allergies.   Review of Systems Review of Systems  Constitutional:  Negative for fever.  HENT:  Negative for sore throat.   Respiratory:  Negative for shortness of breath.   Cardiovascular:  Negative for chest pain.  Gastrointestinal:  Negative for abdominal pain, nausea and vomiting.   Genitourinary:  Positive for penile discharge. Negative for difficulty urinating, dysuria, frequency, penile pain, penile swelling, scrotal swelling and testicular pain.  Skin:  Negative for rash.  Neurological:  Negative for dizziness, light-headedness and headaches.    Physical Exam Triage Vital Signs ED Triage Vitals  Enc Vitals Group     BP 05/03/21 1239 117/78     Pulse Rate 05/03/21 1239 64     Resp 05/03/21 1239 18     Temp 05/03/21 1239 97.9 F (36.6 C)     Temp Source 05/03/21 1239 Oral     SpO2 05/03/21 1239 97 %     Weight --      Height --      Head Circumference --      Peak Flow --      Pain Score 05/03/21 1243 0     Pain Loc --      Pain Edu? --      Excl. in GC? --    No data found.  Updated Vital Signs BP 117/78 (BP Location: Right Arm)   Pulse 64   Temp 97.9 F (36.6 C) (Oral)   Resp 18   SpO2 97%   Visual Acuity Right Eye Distance:   Left Eye Distance:   Bilateral Distance:    Right Eye Near:   Left Eye Near:    Bilateral Near:     Physical Exam Vitals and nursing note reviewed.  Constitutional:      Appearance: He is well-developed.     Comments: No acute distress  HENT:     Head: Normocephalic and atraumatic.     Nose: Nose normal.  Eyes:     Conjunctiva/sclera: Conjunctivae normal.  Cardiovascular:     Rate and Rhythm: Normal rate.  Pulmonary:     Effort: Pulmonary effort is normal. No respiratory distress.  Abdominal:     General: There is no distension.  Musculoskeletal:        General: Normal range of motion.     Cervical back: Neck supple.  Skin:    General: Skin is warm and dry.  Neurological:     Mental Status: He is alert and oriented to person, place, and time.     UC Treatments / Results  Labs (all labs ordered are listed, but only abnormal results are displayed) Labs Reviewed  CYTOLOGY, (ORAL, ANAL, URETHRAL) ANCILLARY ONLY    EKG   Radiology No results found.  Procedures Procedures (including  critical care time)  Medications Ordered in UC Medications  cefTRIAXone (ROCEPHIN) injection 500 mg (500 mg Intramuscular Given 05/03/21 1306)    Initial Impression / Assessment and Plan / UC Course  I have reviewed the triage vital signs and the nursing notes.  Pertinent labs & imaging results that were available during my care of the patient were reviewed by me and considered in my medical decision making (see chart for details).     25 year old male with penile discharge-empirically treating for gonorrhea and chlamydia with Rocephin and doxycycline, urethral swab pending to further screen for STDs.  We will call with results and provide further treatment if needed.  Discussed strict return precautions. Patient verbalized understanding and is agreeable with plan.  Final Clinical Impressions(s) / UC Diagnoses   Final diagnoses:  Penile discharge     Discharge Instructions      We gave you a shot of Rocephin to treat gonorrhea, please pick up doxycycline from the pharmacy and take twice daily for the next week to cover chlamydia.  No intercourse x10 days.  We are testing you for Gonorrhea, Chlamydia and Trichomonas. We will call you if anything is positive and let you know if you require any further treatment. Please inform partner of any positive results.  Please return if symptoms not improving with treatment, development of fever, nausea, vomiting, abdominal pain, scrotal pain.     ED Prescriptions     Medication Sig Dispense Auth. Provider   doxycycline (VIBRAMYCIN) 100 MG capsule Take 1 capsule (100 mg total) by mouth 2 (two) times daily for 7 days. 14 capsule Danaiya Steadman, [place] C, PA-C      PDMP not reviewed this encounter.   Sharyon Cable [place] C, PA-C 05/03/21 1309

## 2021-05-03 NOTE — ED Triage Notes (Signed)
Pt c/o penile discharge, denies other symptoms. Starterd: last week

## 2021-05-04 LAB — CYTOLOGY, (ORAL, ANAL, URETHRAL) ANCILLARY ONLY
Chlamydia: NEGATIVE
Comment: NEGATIVE
Comment: NEGATIVE
Comment: NORMAL
Neisseria Gonorrhea: NEGATIVE
Trichomonas: NEGATIVE

## 2021-05-31 ENCOUNTER — Other Ambulatory Visit: Payer: Self-pay

## 2021-05-31 ENCOUNTER — Encounter (HOSPITAL_COMMUNITY): Payer: Self-pay

## 2021-05-31 ENCOUNTER — Ambulatory Visit (HOSPITAL_COMMUNITY)
Admission: RE | Admit: 2021-05-31 | Discharge: 2021-05-31 | Disposition: A | Discharge status: Home / Self Care | Payer: 59 | Source: Ambulatory Visit | Attending: Student | Admitting: Student

## 2021-05-31 VITALS — BP 139/77 | HR 71 | Temp 97.8°F | Resp 16

## 2021-05-31 DIAGNOSIS — R369 Urethral discharge, unspecified: Secondary | ICD-10-CM | POA: Diagnosis not present

## 2021-05-31 DIAGNOSIS — Z20828 Contact with and (suspected) exposure to other viral communicable diseases: Secondary | ICD-10-CM | POA: Diagnosis not present

## 2021-05-31 MED ORDER — METRONIDAZOLE 500 MG PO TABS: 2000.0000 mg | ORAL_TABLET | Freq: Once | ORAL | 0 refills | Status: AC

## 2021-05-31 NOTE — ED Triage Notes (Signed)
Pt c/o penile discharge that has ben ongoing for a while. Reports that been tested for STIs several times that are always negative. Reports told bacterial and was prescribed but didn't take the whole treatment due to father dying from that antibiotic.

## 2021-05-31 NOTE — ED Provider Notes (Signed)
[facility]    CSN: [id number] Arrival date & time: 05/31/21  1158      History   Chief Complaint Chief Complaint  Patient presents with   Penile Discharge    HPI Seth Weber is a 25 y.o. male presenting with chronic penile discharge for over 2 years. Medical history penile discharge.  States that the discharge is unchanged, his last visit with [place] was 8/22 and he was treated with Rocephin at that time, STI panel was negative.  States he started the doxycycline and was tolerating this well, but he stopped taking it halfway through because he states his dad actually died after taking doxycycline.  He states multiple times he did not have any symptoms himself while taking doxycycline, but his mom told him to stop this nonetheless.  States today the discharge continues to be clear, without penile irritation, dysuria, hematuria, abdominal pain, leg pain.  Denies new partners.  Does state that his current partner has genital herpes and he is requesting a test for this, but denies current penile or testicular lesions.  HPI  History reviewed. No pertinent past medical history.  Patient Active Problem List   Diagnosis Date Noted   Closed nondisplaced fracture of proximal phalanx of left thumb 06/03/2020    History reviewed. No pertinent surgical history.     Home Medications    Prior to Admission medications   Medication Sig Start Date End Date Taking? Authorizing Provider  metroNIDAZOLE (FLAGYL) 500 MG tablet Take 4 tablets (2,000 mg total) by mouth once for 1 dose. 05/31/21 05/31/21 Yes Rhys Martini, PA-C  amoxicillin-clavulanate (AUGMENTIN) 875-125 MG tablet Take 1 tablet by mouth 2 (two) times daily.    [provider]  ibuprofen (ADVIL) 800 MG tablet Take 1 tablet (800 mg total) by mouth 3 (three) times daily. 04/15/21   Wieters, Junius Creamer, PA-C    Family History No family history on file.  Social History Social History   Tobacco Use   Smoking  status: Never   Smokeless tobacco: Never  Substance Use Topics   Alcohol use: Yes    Comment: all above   Drug use: Never     Allergies   Patient has no known allergies.   Review of Systems Review of Systems  Constitutional:  Negative for appetite change, chills, diaphoresis, fever and unexpected weight change.  HENT:  Negative for congestion, ear pain, sinus pressure, sinus pain, sneezing, sore throat and trouble swallowing.   Respiratory:  Negative for cough, chest tightness and shortness of breath.   Cardiovascular:  Negative for chest pain.  Gastrointestinal:  Negative for abdominal distention, abdominal pain, anal bleeding, blood in stool, constipation, diarrhea, nausea, rectal pain and vomiting.  Genitourinary:  Positive for penile discharge. Negative for decreased urine volume, dysuria, flank pain, frequency, genital sores, hematuria, penile pain, penile swelling, scrotal swelling, testicular pain and urgency.  Musculoskeletal:  Negative for back pain and myalgias.  Neurological:  Negative for dizziness, light-headedness and headaches.  All other systems reviewed and are negative.   Physical Exam Triage Vital Signs ED Triage Vitals  Enc Vitals Group     BP 05/31/21 1222 139/77     Pulse Rate 05/31/21 1222 71     Resp 05/31/21 1222 16     Temp 05/31/21 1222 97.8 F (36.6 C)     Temp Source 05/31/21 1222 Oral     SpO2 05/31/21 1222 97 %     Weight --  Height --      Head Circumference --      Peak Flow --      Pain Score 05/31/21 1221 0     Pain Loc --      Pain Edu? --      Excl. in GC? --    No data found.  Updated Vital Signs BP 139/77 (BP Location: Right Arm)   Pulse 71   Temp 97.8 F (36.6 C) (Oral)   Resp 16   SpO2 97%   Visual Acuity Right Eye Distance:   Left Eye Distance:   Bilateral Distance:    Right Eye Near:   Left Eye Near:    Bilateral Near:     Physical Exam Vitals reviewed.  Constitutional:      General: He is not in acute  distress.    Appearance: Normal appearance. He is not ill-appearing.  HENT:     Head: Normocephalic and atraumatic.     Mouth/Throat:     Mouth: Mucous membranes are moist.     Comments: Moist mucous membranes Eyes:     Extraocular Movements: Extraocular movements intact.     Pupils: Pupils are equal, round, and reactive to light.  Cardiovascular:     Rate and Rhythm: Normal rate and regular rhythm.     Heart sounds: Normal heart sounds.  Pulmonary:     Effort: Pulmonary effort is normal.     Breath sounds: Normal breath sounds. No wheezing, rhonchi or rales.  Abdominal:     General: Bowel sounds are normal. There is no distension.     Palpations: Abdomen is soft. There is no mass.     Tenderness: There is no abdominal tenderness. There is no right CVA tenderness, left CVA tenderness, guarding or rebound.  Genitourinary:    Comments: deferred Skin:    General: Skin is warm.     Capillary Refill: Capillary refill takes less than 2 seconds.     Comments: Good skin turgor  Neurological:     General: No focal deficit present.     Mental Status: He is alert and oriented to person, place, and time.  Psychiatric:        Mood and Affect: Mood normal.        Behavior: Behavior normal.     UC Treatments / Results  Labs (all labs ordered are listed, but only abnormal results are displayed) Labs Reviewed - No data to display  EKG   Radiology No results found.  Procedures Procedures (including critical care time)  Medications Ordered in UC Medications - No data to display  Initial Impression / Assessment and Plan / UC Course  I have reviewed the triage vital signs and the nursing notes.  Pertinent labs & imaging results that were available during my care of the patient were reviewed by me and considered in my medical decision making (see chart for details).     This patient is a very pleasant 25 y.o. year old male presenting with chronic penile discharge x2 years. Last  STI screen with [place] was 04/2021 and was negative but he was treated with Rocephin and doxycycline with temporary improvement. Stopped the doxycycline early because he found out that his dad had issues with it, but patient actually seemed to tolerate it well. Had been followed by PCP and urology for the chronic discharge without improvement. Patient is requesting another antibiotic; will send flagyl 2g. Patient is requesting HSV screening but as he does not have active lesions  will defer this for now, patient is in agreement. Safe sex precautions. ED return precautions discussed. Patient verbalizes understanding and agreement. Coding Level 4 for review of past notes/labs, order and interpretation of labs today, and prescription drug management   Final Clinical Impressions(s) / UC Diagnoses   Final diagnoses:  Penile discharge  Exposure to herpes simplex virus (HSV)     Discharge Instructions      -Flagyl (metronidazole) 4 pills taken at the same time once. Take with food. Avoid alcohol today and tomorrow, as this can cause nausea and vomiting.  -Wash with gentle soap and water only -If you develop spots or sores on the penis, follow-up with [place] for HSV testing   ED Prescriptions     Medication Sig Dispense Auth. Provider   metroNIDAZOLE (FLAGYL) 500 MG tablet Take 4 tablets (2,000 mg total) by mouth once for 1 dose. 4 tablet Rhys Martini, PA-C      PDMP not reviewed this encounter.   Rhys Martini, PA-C 05/31/21 1310

## 2021-05-31 NOTE — Discharge Instructions (Addendum)
-  Flagyl (metronidazole) 4 pills taken at the same time once. Take with food. Avoid alcohol today and tomorrow, as this can cause nausea and vomiting.  -Wash with gentle soap and water only -If you develop spots or sores on the penis, follow-up with [place] for HSV testing

## 2021-07-09 ENCOUNTER — Ambulatory Visit: Admit: 2021-07-09 | Discharge status: Absent without leave | Payer: 59

## 2021-07-09 ENCOUNTER — Other Ambulatory Visit: Payer: Self-pay

## 2021-07-09 ENCOUNTER — Ambulatory Visit
Admission: EM | Admit: 2021-07-09 | Discharge: 2021-07-09 | Disposition: A | Discharge status: Home / Self Care | Payer: 59 | Attending: Internal Medicine | Admitting: Internal Medicine

## 2021-07-09 DIAGNOSIS — R509 Fever, unspecified: Secondary | ICD-10-CM | POA: Diagnosis not present

## 2021-07-09 DIAGNOSIS — J101 Influenza due to other identified influenza virus with other respiratory manifestations: Secondary | ICD-10-CM | POA: Diagnosis not present

## 2021-07-09 LAB — POCT INFLUENZA A/B
Influenza A, POC: POSITIVE — AB
Influenza B, POC: NEGATIVE

## 2021-07-09 MED ORDER — OSELTAMIVIR PHOSPHATE 75 MG PO CAPS: 75.0000 mg | ORAL_CAPSULE | Freq: Two times a day (BID) | ORAL | 0 refills | Status: AC

## 2021-07-09 NOTE — Discharge Instructions (Signed)
You have tested positive for influenza A.  This is being treated with Tamiflu.

## 2021-07-09 NOTE — ED Triage Notes (Signed)
Pt c/o headache, body aches, fever (103F), cough  Denies sore throat, nausea, vomiting, diarrhea, constipation.   Onset Tuesday

## 2021-07-09 NOTE — ED Provider Notes (Signed)
[facility]    CSN: [id number] Arrival date & time: 07/09/21  1038      History   Chief Complaint Chief Complaint  Patient presents with   Fever    HPI Seth Weber is a 25 y.o. male.   Patient presents with fever, cough, nasal congestion, headache, body aches that has been present for approximately 4 days.  Cough is productive at times per patient.  T-max at home was 103.  Denies any known sick contacts.  Denies nausea, vomiting, diarrhea, abdominal pain.  Denies chest pain or shortness of breath.   Fever  History reviewed. No pertinent past medical history.  Patient Active Problem List   Diagnosis Date Noted   Closed nondisplaced fracture of proximal phalanx of left thumb 06/03/2020    History reviewed. No pertinent surgical history.     Home Medications    Prior to Admission medications   Medication Sig Start Date End Date Taking? Authorizing Provider  oseltamivir (TAMIFLU) 75 MG capsule Take 1 capsule (75 mg total) by mouth every 12 (twelve) hours. 07/09/21  Yes Seth Weber, Seth Salter E, FNP  amoxicillin-clavulanate (AUGMENTIN) 875-125 MG tablet Take 1 tablet by mouth 2 (two) times daily.    [provider]  ibuprofen (ADVIL) 800 MG tablet Take 1 tablet (800 mg total) by mouth 3 (three) times daily. 04/15/21   Seth Weber, Seth Creamer, PA-C    Family History History reviewed. No pertinent family history.  Social History Social History   Tobacco Use   Smoking status: Never   Smokeless tobacco: Never  Substance Use Topics   Alcohol use: Yes    Comment: all above   Drug use: Never     Allergies   Patient has no known allergies.   Review of Systems Review of Systems Per HPI  Physical Exam Triage Vital Signs ED Triage Vitals [07/09/21 1350]  Enc Vitals Group     BP 134/72     Pulse Rate 69     Resp 18     Temp 98.1 F (36.7 C)     Temp Source Oral     SpO2 98 %     Weight      Height      Head Circumference      Peak Flow       Pain Score 0     Pain Loc      Pain Edu?      Excl. in GC?    No data found.  Updated Vital Signs BP 134/72 (BP Location: Left Arm)   Pulse 69   Temp 98.1 F (36.7 C) (Oral)   Resp 18   SpO2 98%   Visual Acuity Right Eye Distance:   Left Eye Distance:   Bilateral Distance:    Right Eye Near:   Left Eye Near:    Bilateral Near:     Physical Exam Constitutional:      General: He is not in acute distress.    Appearance: Normal appearance. He is not toxic-appearing or diaphoretic.  HENT:     Head: Normocephalic and atraumatic.     Right Ear: Tympanic membrane and ear canal normal.     Left Ear: Tympanic membrane and ear canal normal.     Nose: Congestion present.     Mouth/Throat:     Mouth: Mucous membranes are moist.     Pharynx: No posterior oropharyngeal erythema.  Eyes:     Extraocular Movements: Extraocular movements intact.  Conjunctiva/sclera: Conjunctivae normal.     Pupils: Pupils are equal, round, and reactive to light.  Cardiovascular:     Rate and Rhythm: Normal rate and regular rhythm.     Pulses: Normal pulses.     Heart sounds: Normal heart sounds.  Pulmonary:     Effort: Pulmonary effort is normal. No respiratory distress.     Breath sounds: Normal breath sounds. No stridor. No wheezing, rhonchi or rales.  Abdominal:     General: Abdomen is flat. Bowel sounds are normal.     Palpations: Abdomen is soft.  Musculoskeletal:        General: Normal range of motion.     Cervical back: Normal range of motion.  Skin:    General: Skin is warm and dry.  Neurological:     General: No focal deficit present.     Mental Status: He is alert and oriented to person, place, and time. Mental status is at baseline.  Psychiatric:        Mood and Affect: Mood normal.        Behavior: Behavior normal.     UC Treatments / Results  Labs (all labs ordered are listed, but only abnormal results are displayed) Labs Reviewed  POCT INFLUENZA A/B - Abnormal;  Notable for the following components:      Result Value   Influenza A, POC Positive (*)    All other components within normal limits    EKG   Radiology No results found.  Procedures Procedures (including critical care time)  Medications Ordered in UC Medications - No data to display  Initial Impression / Assessment and Plan / UC Course  I have reviewed the triage vital signs and the nursing notes.  Pertinent labs & imaging results that were available during my care of the patient were reviewed by me and considered in my medical decision making (see chart for details).     Patient tested positive for influenza A.  Advised patient that Tamiflu may not be helpful due to symptoms being present over 48 hours.  Patient still wishes to have Tamiflu.  Tamiflu sent x5 days.  Discussed supportive care and symptomatic management with patient.  Fever monitoring and management discussed with patient.  No red flags seen on exam.  Discussed strict return precautions.  Patient verbalized understanding and was agreeable plan. Final Clinical Impressions(s) / UC Diagnoses   Final diagnoses:  Influenza A  Fever, unspecified     Discharge Instructions      You have tested positive for influenza A.  This is being treated with Tamiflu.     ED Prescriptions     Medication Sig Dispense Auth. Provider   oseltamivir (TAMIFLU) 75 MG capsule Take 1 capsule (75 mg total) by mouth every 12 (twelve) hours. 10 capsule Seth Weber, Seth Weber      PDMP not reviewed this encounter.   Seth Weber, Seth Weber 07/09/21 1451

## 2022-12-29 ENCOUNTER — Other Ambulatory Visit: Payer: Self-pay | Admitting: Urology

## 2022-12-29 DIAGNOSIS — N509 Disorder of male genital organs, unspecified: Secondary | ICD-10-CM

## 2023-01-10 ENCOUNTER — Ambulatory Visit
Admission: RE | Admit: 2023-01-10 | Discharge: 2023-01-10 | Disposition: A | Discharge status: Home / Self Care | Payer: Managed Care, Other (non HMO) | Source: Ambulatory Visit | Attending: Urology

## 2023-01-10 DIAGNOSIS — N509 Disorder of male genital organs, unspecified: Secondary | ICD-10-CM

## 2023-11-29 ENCOUNTER — Telehealth
